# Patient Record
Sex: Male | Born: 1940 | Race: White | Hispanic: No | Smoking: Former smoker
Health system: Southern US, Community
[De-identification: ages and names within clinical notes are randomized; demographics above are authoritative.]

## PROBLEM LIST (undated history)

## (undated) DIAGNOSIS — M1990 Unspecified osteoarthritis, unspecified site: Secondary | ICD-10-CM

## (undated) DIAGNOSIS — N401 Enlarged prostate with lower urinary tract symptoms: Secondary | ICD-10-CM

## (undated) DIAGNOSIS — M109 Gout, unspecified: Secondary | ICD-10-CM

## (undated) DIAGNOSIS — Z9889 Other specified postprocedural states: Secondary | ICD-10-CM

## (undated) DIAGNOSIS — G8929 Other chronic pain: Secondary | ICD-10-CM

## (undated) DIAGNOSIS — Z86010 Personal history of colonic polyps: Secondary | ICD-10-CM

## (undated) DIAGNOSIS — R112 Nausea with vomiting, unspecified: Secondary | ICD-10-CM

## (undated) DIAGNOSIS — K2960 Other gastritis without bleeding: Secondary | ICD-10-CM

## (undated) DIAGNOSIS — T39395A Adverse effect of other nonsteroidal anti-inflammatory drugs [NSAID], initial encounter: Secondary | ICD-10-CM

## (undated) DIAGNOSIS — D509 Iron deficiency anemia, unspecified: Secondary | ICD-10-CM

## (undated) DIAGNOSIS — K219 Gastro-esophageal reflux disease without esophagitis: Secondary | ICD-10-CM

## (undated) DIAGNOSIS — M545 Low back pain, unspecified: Secondary | ICD-10-CM

## (undated) DIAGNOSIS — G2581 Restless legs syndrome: Secondary | ICD-10-CM

## (undated) DIAGNOSIS — I10 Essential (primary) hypertension: Secondary | ICD-10-CM

## (undated) DIAGNOSIS — J189 Pneumonia, unspecified organism: Secondary | ICD-10-CM

## (undated) DIAGNOSIS — T8859XA Other complications of anesthesia, initial encounter: Secondary | ICD-10-CM

## (undated) DIAGNOSIS — G5601 Carpal tunnel syndrome, right upper limb: Secondary | ICD-10-CM

## (undated) DIAGNOSIS — T4145XA Adverse effect of unspecified anesthetic, initial encounter: Secondary | ICD-10-CM

## (undated) DIAGNOSIS — M722 Plantar fascial fibromatosis: Secondary | ICD-10-CM

## (undated) DIAGNOSIS — R351 Nocturia: Secondary | ICD-10-CM

## (undated) DIAGNOSIS — M180 Bilateral primary osteoarthritis of first carpometacarpal joints: Secondary | ICD-10-CM

## (undated) DIAGNOSIS — G4700 Insomnia, unspecified: Secondary | ICD-10-CM

## (undated) HISTORY — DX: Adverse effect of other nonsteroidal anti-inflammatory drugs (NSAID), initial encounter: T39.395A

## (undated) HISTORY — DX: Pneumonia, unspecified organism: J18.9

## (undated) HISTORY — DX: Plantar fascial fibromatosis: M72.2

## (undated) HISTORY — PX: TOTAL KNEE ARTHROPLASTY: SHX125

## (undated) HISTORY — DX: Bilateral primary osteoarthritis of first carpometacarpal joints: M18.0

## (undated) HISTORY — DX: Low back pain, unspecified: M54.50

## (undated) HISTORY — DX: Personal history of colonic polyps: Z86.010

## (undated) HISTORY — DX: Other chronic pain: G89.29

## (undated) HISTORY — PX: MASS EXCISION: SHX2000

## (undated) HISTORY — DX: Gout, unspecified: M10.9

## (undated) HISTORY — DX: Iron deficiency anemia, unspecified: D50.9

## (undated) HISTORY — PX: CATARACT EXTRACTION: SUR2

## (undated) HISTORY — DX: Other gastritis without bleeding: K29.60

## (undated) HISTORY — PX: OTHER SURGICAL HISTORY: SHX169

## (undated) HISTORY — DX: Carpal tunnel syndrome, right upper limb: G56.01

## (undated) HISTORY — DX: Insomnia, unspecified: G47.00

## (undated) HISTORY — DX: Low back pain: M54.5

## (undated) HISTORY — DX: Restless legs syndrome: G25.81

## (undated) HISTORY — DX: Gastro-esophageal reflux disease without esophagitis: K21.9

---

## 1971-08-11 HISTORY — PX: LUMBAR SPINE SURGERY: SHX701

## 2004-08-10 HISTORY — PX: OTHER SURGICAL HISTORY: SHX169

## 2014-08-10 HISTORY — PX: OTHER SURGICAL HISTORY: SHX169

## 2015-03-07 ENCOUNTER — Other Ambulatory Visit: Payer: Self-pay | Admitting: Family Medicine

## 2015-03-07 DIAGNOSIS — Z136 Encounter for screening for cardiovascular disorders: Secondary | ICD-10-CM

## 2015-03-12 ENCOUNTER — Ambulatory Visit
Admission: RE | Admit: 2015-03-12 | Discharge: 2015-03-12 | Disposition: A | Discharge status: Home / Self Care | Payer: Self-pay | Source: Ambulatory Visit | Attending: Family Medicine | Admitting: Family Medicine

## 2015-03-12 DIAGNOSIS — Z136 Encounter for screening for cardiovascular disorders: Secondary | ICD-10-CM

## 2016-03-12 ENCOUNTER — Other Ambulatory Visit: Payer: Self-pay | Admitting: Orthopedic Surgery

## 2016-06-10 DIAGNOSIS — M722 Plantar fascial fibromatosis: Secondary | ICD-10-CM

## 2016-06-10 HISTORY — DX: Plantar fascial fibromatosis: M72.2

## 2016-06-29 ENCOUNTER — Encounter (HOSPITAL_BASED_OUTPATIENT_CLINIC_OR_DEPARTMENT_OTHER): Payer: Self-pay | Admitting: *Deleted

## 2016-06-30 ENCOUNTER — Encounter (HOSPITAL_BASED_OUTPATIENT_CLINIC_OR_DEPARTMENT_OTHER)
Admission: RE | Admit: 2016-06-30 | Discharge: 2016-06-30 | Disposition: A | Discharge status: Home / Self Care | Payer: Medicare Other | Source: Ambulatory Visit | Attending: Orthopedic Surgery | Admitting: Orthopedic Surgery

## 2016-06-30 DIAGNOSIS — Z029 Encounter for administrative examinations, unspecified: Secondary | ICD-10-CM | POA: Diagnosis present

## 2016-07-01 ENCOUNTER — Other Ambulatory Visit: Payer: Self-pay | Admitting: Orthopedic Surgery

## 2016-07-07 ENCOUNTER — Encounter (HOSPITAL_BASED_OUTPATIENT_CLINIC_OR_DEPARTMENT_OTHER): Payer: Self-pay | Admitting: Anesthesiology

## 2016-07-07 ENCOUNTER — Encounter (HOSPITAL_BASED_OUTPATIENT_CLINIC_OR_DEPARTMENT_OTHER)
Admission: RE | Disposition: A | Discharge status: Home / Self Care | Payer: Self-pay | Source: Ambulatory Visit | Attending: Orthopedic Surgery

## 2016-07-07 ENCOUNTER — Ambulatory Visit (HOSPITAL_BASED_OUTPATIENT_CLINIC_OR_DEPARTMENT_OTHER)
Admission: RE | Admit: 2016-07-07 | Discharge: 2016-07-07 | Disposition: A | Discharge status: Home / Self Care | Payer: Medicare Other | Source: Ambulatory Visit | Attending: Orthopedic Surgery | Admitting: Orthopedic Surgery

## 2016-07-07 DIAGNOSIS — Z538 Procedure and treatment not carried out for other reasons: Secondary | ICD-10-CM | POA: Insufficient documentation

## 2016-07-07 DIAGNOSIS — I10 Essential (primary) hypertension: Secondary | ICD-10-CM | POA: Insufficient documentation

## 2016-07-07 HISTORY — DX: Essential (primary) hypertension: I10

## 2016-07-07 HISTORY — DX: Other specified postprocedural states: Z98.890

## 2016-07-07 HISTORY — DX: Nocturia: R35.1

## 2016-07-07 HISTORY — DX: Unspecified osteoarthritis, unspecified site: M19.90

## 2016-07-07 HISTORY — DX: Benign prostatic hyperplasia with lower urinary tract symptoms: N40.1

## 2016-07-07 HISTORY — DX: Nausea with vomiting, unspecified: R11.2

## 2016-07-07 HISTORY — DX: Adverse effect of unspecified anesthetic, initial encounter: T41.45XA

## 2016-07-07 HISTORY — DX: Other complications of anesthesia, initial encounter: T88.59XA

## 2016-07-07 LAB — SEDIMENTATION RATE: Sed Rate: 44 mm/hr — ABNORMAL HIGH (ref 0–16)

## 2016-07-07 LAB — CBC WITH DIFFERENTIAL/PLATELET
BASOS ABS: 0 10*3/uL (ref 0.0–0.1)
Basophils Relative: 0 %
Eosinophils Absolute: 0.1 10*3/uL (ref 0.0–0.7)
Eosinophils Relative: 1 %
HEMATOCRIT: 38.3 % — AB (ref 39.0–52.0)
HEMOGLOBIN: 13.1 g/dL (ref 13.0–17.0)
LYMPHS PCT: 15 %
Lymphs Abs: 1.4 10*3/uL (ref 0.7–4.0)
MCH: 27.3 pg (ref 26.0–34.0)
MCHC: 34.2 g/dL (ref 30.0–36.0)
MCV: 79.8 fL (ref 78.0–100.0)
MONO ABS: 1 10*3/uL (ref 0.1–1.0)
Monocytes Relative: 11 %
NEUTROS ABS: 6.7 10*3/uL (ref 1.7–7.7)
NEUTROS PCT: 73 %
Platelets: 264 10*3/uL (ref 150–400)
RBC: 4.8 MIL/uL (ref 4.22–5.81)
RDW: 15 % (ref 11.5–15.5)
WBC: 9.2 10*3/uL (ref 4.0–10.5)

## 2016-07-07 LAB — URIC ACID: Uric Acid, Serum: 4.4 mg/dL (ref 4.4–7.6)

## 2016-07-07 SURGERY — CARPAL TUNNEL RELEASE
Anesthesia: Choice | Laterality: Right

## 2016-07-07 NOTE — Anesthesia Preprocedure Evaluation (Deleted)
Anesthesia Evaluation  Patient identified by MRN, date of birth, ID band Patient awake    Reviewed: Allergy & Precautions, NPO status , Patient's Chart, lab work & pertinent test results  Airway Mallampati: II  TM Distance: >3 FB Neck ROM: Full    Dental  (+) Dental Advisory Given   Pulmonary former smoker,    breath sounds clear to auscultation       Cardiovascular hypertension, Pt. on medications  Rhythm:Regular Rate:Normal     Neuro/Psych negative neurological ROS     GI/Hepatic negative GI ROS, Neg liver ROS,   Endo/Other  negative endocrine ROS  Renal/GU negative Renal ROS     Musculoskeletal  (+) Arthritis ,   Abdominal   Peds  Hematology negative hematology ROS (+)   Anesthesia Other Findings   Reproductive/Obstetrics                             Anesthesia Physical Anesthesia Plan  ASA: II  Anesthesia Plan: General   Post-op Pain Management:    Induction: Intravenous  Airway Management Planned: LMA  Additional Equipment:   Intra-op Plan:   Post-operative Plan: Extubation in OR  Informed Consent: I have reviewed the patients History and Physical, chart, labs and discussed the procedure including the risks, benefits and alternatives for the proposed anesthesia with the patient or authorized representative who has indicated his/her understanding and acceptance.   Dental advisory given  Plan Discussed with:   Anesthesia Plan Comments:         Anesthesia Quick Evaluation

## 2016-07-07 NOTE — Progress Notes (Signed)
Surgery cancelled due to possible infection/ gout to left arm per Dr. Fredna Dow. Labs drawn per Dr. Levell July order. Pt d/c home.

## 2016-07-09 NOTE — H&P (Signed)
Surgery cancelled

## 2016-07-28 ENCOUNTER — Encounter (HOSPITAL_BASED_OUTPATIENT_CLINIC_OR_DEPARTMENT_OTHER): Payer: Self-pay | Admitting: *Deleted

## 2016-07-28 ENCOUNTER — Other Ambulatory Visit: Payer: Self-pay | Admitting: Orthopedic Surgery

## 2016-07-30 ENCOUNTER — Encounter (HOSPITAL_BASED_OUTPATIENT_CLINIC_OR_DEPARTMENT_OTHER): Payer: Self-pay | Admitting: Anesthesiology

## 2016-07-30 ENCOUNTER — Ambulatory Visit (HOSPITAL_BASED_OUTPATIENT_CLINIC_OR_DEPARTMENT_OTHER): Payer: Medicare Other | Admitting: Anesthesiology

## 2016-07-30 ENCOUNTER — Ambulatory Visit (HOSPITAL_BASED_OUTPATIENT_CLINIC_OR_DEPARTMENT_OTHER)
Admission: RE | Admit: 2016-07-30 | Discharge: 2016-07-30 | Disposition: A | Discharge status: Home / Self Care | Payer: Medicare Other | Source: Ambulatory Visit | Attending: Orthopedic Surgery | Admitting: Orthopedic Surgery

## 2016-07-30 ENCOUNTER — Encounter (HOSPITAL_BASED_OUTPATIENT_CLINIC_OR_DEPARTMENT_OTHER)
Admission: RE | Disposition: A | Discharge status: Home / Self Care | Payer: Self-pay | Source: Ambulatory Visit | Attending: Orthopedic Surgery

## 2016-07-30 DIAGNOSIS — M19041 Primary osteoarthritis, right hand: Secondary | ICD-10-CM | POA: Insufficient documentation

## 2016-07-30 DIAGNOSIS — M1990 Unspecified osteoarthritis, unspecified site: Secondary | ICD-10-CM | POA: Diagnosis not present

## 2016-07-30 DIAGNOSIS — I10 Essential (primary) hypertension: Secondary | ICD-10-CM | POA: Diagnosis not present

## 2016-07-30 DIAGNOSIS — M1811 Unilateral primary osteoarthritis of first carpometacarpal joint, right hand: Secondary | ICD-10-CM | POA: Insufficient documentation

## 2016-07-30 DIAGNOSIS — Z79899 Other long term (current) drug therapy: Secondary | ICD-10-CM | POA: Insufficient documentation

## 2016-07-30 DIAGNOSIS — G5621 Lesion of ulnar nerve, right upper limb: Secondary | ICD-10-CM | POA: Diagnosis not present

## 2016-07-30 DIAGNOSIS — G5603 Carpal tunnel syndrome, bilateral upper limbs: Secondary | ICD-10-CM | POA: Insufficient documentation

## 2016-07-30 DIAGNOSIS — G5623 Lesion of ulnar nerve, bilateral upper limbs: Secondary | ICD-10-CM | POA: Insufficient documentation

## 2016-07-30 DIAGNOSIS — G5601 Carpal tunnel syndrome, right upper limb: Secondary | ICD-10-CM | POA: Diagnosis present

## 2016-07-30 DIAGNOSIS — Z87891 Personal history of nicotine dependence: Secondary | ICD-10-CM | POA: Diagnosis not present

## 2016-07-30 DIAGNOSIS — M5412 Radiculopathy, cervical region: Secondary | ICD-10-CM | POA: Insufficient documentation

## 2016-07-30 DIAGNOSIS — M419 Scoliosis, unspecified: Secondary | ICD-10-CM | POA: Insufficient documentation

## 2016-07-30 DIAGNOSIS — Z791 Long term (current) use of non-steroidal anti-inflammatories (NSAID): Secondary | ICD-10-CM | POA: Insufficient documentation

## 2016-07-30 HISTORY — PX: ULNAR NERVE TRANSPOSITION: SHX2595

## 2016-07-30 HISTORY — PX: CARPAL TUNNEL RELEASE: SHX101

## 2016-07-30 LAB — POCT I-STAT, CHEM 8
BUN: 20 mg/dL (ref 6–20)
CALCIUM ION: 1.26 mmol/L (ref 1.15–1.40)
CHLORIDE: 103 mmol/L (ref 101–111)
CREATININE: 0.8 mg/dL (ref 0.61–1.24)
GLUCOSE: 94 mg/dL (ref 65–99)
HCT: 47 % (ref 39.0–52.0)
Hemoglobin: 16 g/dL (ref 13.0–17.0)
POTASSIUM: 3.9 mmol/L (ref 3.5–5.1)
Sodium: 138 mmol/L (ref 135–145)
TCO2: 23 mmol/L (ref 0–100)

## 2016-07-30 SURGERY — CARPAL TUNNEL RELEASE
Anesthesia: Regional | Site: Arm Upper | Laterality: Right

## 2016-07-30 MED ORDER — CEFAZOLIN SODIUM-DEXTROSE 2-4 GM/100ML-% IV SOLN
2.0000 g | INTRAVENOUS | Status: AC
  Administered 2016-07-30: 2 g via INTRAVENOUS

## 2016-07-30 MED ORDER — FENTANYL CITRATE (PF) 100 MCG/2ML IJ SOLN
50.0000 ug | INTRAMUSCULAR | Status: DC | PRN
  Administered 2016-07-30: 100 ug via INTRAVENOUS

## 2016-07-30 MED ORDER — LIDOCAINE HCL (CARDIAC) 20 MG/ML IV SOLN
INTRAVENOUS | Status: DC | PRN
  Administered 2016-07-30: 40 mg via INTRAVENOUS

## 2016-07-30 MED ORDER — FENTANYL CITRATE (PF) 100 MCG/2ML IJ SOLN
INTRAMUSCULAR | Status: AC
  Filled 2016-07-30: qty 2

## 2016-07-30 MED ORDER — EPHEDRINE SULFATE 50 MG/ML IJ SOLN
INTRAMUSCULAR | Status: DC | PRN
  Administered 2016-07-30 (×3): 5 mg via INTRAVENOUS
  Administered 2016-07-30: 10 mg via INTRAVENOUS

## 2016-07-30 MED ORDER — HYDROCODONE-ACETAMINOPHEN 5-325 MG PO TABS: 1.0000 | ORAL_TABLET | Freq: Four times a day (QID) | ORAL | 0 refills | Status: DC | PRN

## 2016-07-30 MED ORDER — EPHEDRINE 5 MG/ML INJ
INTRAVENOUS | Status: AC
  Filled 2016-07-30: qty 10

## 2016-07-30 MED ORDER — GLYCOPYRROLATE 0.2 MG/ML IJ SOLN
INTRAMUSCULAR | Status: DC | PRN
  Administered 2016-07-30: 0.2 mg via INTRAVENOUS

## 2016-07-30 MED ORDER — BUPIVACAINE-EPINEPHRINE (PF) 0.5% -1:200000 IJ SOLN
INTRAMUSCULAR | Status: DC | PRN
  Administered 2016-07-30: 30 mL via PERINEURAL

## 2016-07-30 MED ORDER — LIDOCAINE 2% (20 MG/ML) 5 ML SYRINGE
INTRAMUSCULAR | Status: AC
  Filled 2016-07-30: qty 5

## 2016-07-30 MED ORDER — MIDAZOLAM HCL 2 MG/2ML IJ SOLN
INTRAMUSCULAR | Status: AC
  Filled 2016-07-30: qty 2

## 2016-07-30 MED ORDER — CHLORHEXIDINE GLUCONATE 4 % EX LIQD: 60.0000 mL | Freq: Once | CUTANEOUS | Status: DC

## 2016-07-30 MED ORDER — SCOPOLAMINE 1 MG/3DAYS TD PT72: 1.0000 | MEDICATED_PATCH | Freq: Once | TRANSDERMAL | Status: DC | PRN

## 2016-07-30 MED ORDER — MIDAZOLAM HCL 2 MG/2ML IJ SOLN
1.0000 mg | INTRAMUSCULAR | Status: DC | PRN
  Administered 2016-07-30: 1 mg via INTRAVENOUS

## 2016-07-30 MED ORDER — LACTATED RINGERS IV SOLN
INTRAVENOUS | Status: DC
  Administered 2016-07-30: 11:00:00 via INTRAVENOUS

## 2016-07-30 MED ORDER — PROPOFOL 10 MG/ML IV BOLUS
INTRAVENOUS | Status: AC
  Filled 2016-07-30: qty 20

## 2016-07-30 MED ORDER — FENTANYL CITRATE (PF) 100 MCG/2ML IJ SOLN: 25.0000 ug | INTRAMUSCULAR | Status: DC | PRN

## 2016-07-30 MED ORDER — ONDANSETRON HCL 4 MG/2ML IJ SOLN
INTRAMUSCULAR | Status: DC | PRN
  Administered 2016-07-30: 4 mg via INTRAVENOUS

## 2016-07-30 MED ORDER — DEXAMETHASONE SODIUM PHOSPHATE 4 MG/ML IJ SOLN
INTRAMUSCULAR | Status: DC | PRN
  Administered 2016-07-30: 5 mg via INTRAVENOUS

## 2016-07-30 MED ORDER — ONDANSETRON HCL 4 MG/2ML IJ SOLN
INTRAMUSCULAR | Status: AC
  Filled 2016-07-30: qty 2

## 2016-07-30 MED ORDER — PROPOFOL 10 MG/ML IV BOLUS
INTRAVENOUS | Status: DC | PRN
  Administered 2016-07-30: 150 mg via INTRAVENOUS

## 2016-07-30 SURGICAL SUPPLY — 51 items
BLADE MINI RND TIP GREEN BEAV (BLADE) IMPLANT
BLADE SURG 15 STRL LF DISP TIS (BLADE) ×2 IMPLANT
BLADE SURG 15 STRL SS (BLADE) ×2
BNDG COHESIVE 3X5 TAN STRL LF (GAUZE/BANDAGES/DRESSINGS) ×8 IMPLANT
BNDG ESMARK 4X9 LF (GAUZE/BANDAGES/DRESSINGS) ×4 IMPLANT
BNDG GAUZE ELAST 4 BULKY (GAUZE/BANDAGES/DRESSINGS) ×4 IMPLANT
CHLORAPREP W/TINT 26ML (MISCELLANEOUS) ×4 IMPLANT
CORDS BIPOLAR (ELECTRODE) ×4 IMPLANT
COVER BACK TABLE 60X90IN (DRAPES) ×4 IMPLANT
COVER MAYO STAND STRL (DRAPES) ×4 IMPLANT
CUFF TOURN SGL LL 18 NRW (TOURNIQUET CUFF) ×4 IMPLANT
CUFF TOURNIQUET SINGLE 18IN (TOURNIQUET CUFF) ×4 IMPLANT
DECANTER SPIKE VIAL GLASS SM (MISCELLANEOUS) IMPLANT
DRAPE EXTREMITY T 121X128X90 (DRAPE) ×4 IMPLANT
DRAPE SURG 17X23 STRL (DRAPES) ×4 IMPLANT
DRSG PAD ABDOMINAL 8X10 ST (GAUZE/BANDAGES/DRESSINGS) ×8 IMPLANT
GAUZE SPONGE 4X4 12PLY STRL (GAUZE/BANDAGES/DRESSINGS) ×4 IMPLANT
GAUZE SPONGE 4X4 16PLY XRAY LF (GAUZE/BANDAGES/DRESSINGS) IMPLANT
GAUZE XEROFORM 1X8 LF (GAUZE/BANDAGES/DRESSINGS) ×4 IMPLANT
GLOVE BIOGEL PI IND STRL 7.0 (GLOVE) ×2 IMPLANT
GLOVE BIOGEL PI IND STRL 8 (GLOVE) ×2 IMPLANT
GLOVE BIOGEL PI IND STRL 8.5 (GLOVE) ×2 IMPLANT
GLOVE BIOGEL PI INDICATOR 7.0 (GLOVE) ×2
GLOVE BIOGEL PI INDICATOR 8 (GLOVE) ×2
GLOVE BIOGEL PI INDICATOR 8.5 (GLOVE) ×2
GLOVE ECLIPSE 6.5 STRL STRAW (GLOVE) ×4 IMPLANT
GLOVE SURG ORTHO 8.0 STRL STRW (GLOVE) ×4 IMPLANT
GOWN STRL REUS W/ TWL LRG LVL3 (GOWN DISPOSABLE) ×2 IMPLANT
GOWN STRL REUS W/TWL LRG LVL3 (GOWN DISPOSABLE) ×2
GOWN STRL REUS W/TWL XL LVL3 (GOWN DISPOSABLE) ×4 IMPLANT
LOOP VESSEL MAXI BLUE (MISCELLANEOUS) ×4 IMPLANT
NEEDLE PRECISIONGLIDE 27X1.5 (NEEDLE) ×4 IMPLANT
NS IRRIG 1000ML POUR BTL (IV SOLUTION) ×4 IMPLANT
PACK BASIN DAY SURGERY FS (CUSTOM PROCEDURE TRAY) ×4 IMPLANT
PAD CAST 3X4 CTTN HI CHSV (CAST SUPPLIES) ×2 IMPLANT
PAD CAST 4YDX4 CTTN HI CHSV (CAST SUPPLIES) ×2 IMPLANT
PADDING CAST COTTON 3X4 STRL (CAST SUPPLIES) ×2
PADDING CAST COTTON 4X4 STRL (CAST SUPPLIES) ×2
SLEEVE SCD COMPRESS KNEE MED (MISCELLANEOUS) ×4 IMPLANT
SLING ARM FOAM STRAP LRG (SOFTGOODS) ×4 IMPLANT
SPLINT PLASTER CAST XFAST 3X15 (CAST SUPPLIES) IMPLANT
SPLINT PLASTER XTRA FASTSET 3X (CAST SUPPLIES)
STOCKINETTE 4X48 STRL (DRAPES) ×4 IMPLANT
SUT ETHILON 4 0 PS 2 18 (SUTURE) ×8 IMPLANT
SUT VIC AB 2-0 SH 27 (SUTURE) ×2
SUT VIC AB 2-0 SH 27XBRD (SUTURE) ×2 IMPLANT
SUT VICRYL 4-0 PS2 18IN ABS (SUTURE) ×4 IMPLANT
SYR BULB 3OZ (MISCELLANEOUS) ×4 IMPLANT
SYR CONTROL 10ML LL (SYRINGE) ×4 IMPLANT
TOWEL OR 17X24 6PK STRL BLUE (TOWEL DISPOSABLE) ×4 IMPLANT
UNDERPAD 30X30 (UNDERPADS AND DIAPERS) ×4 IMPLANT

## 2016-07-30 NOTE — Anesthesia Procedure Notes (Addendum)
Anesthesia Regional Block:  Supraclavicular block  Pre-Anesthetic Checklist: ,, timeout performed, Correct Patient, Correct Site, Correct Laterality, Correct Procedure, Correct Position, site marked, Risks and benefits discussed, pre-op evaluation,  At surgeon's request and post-op pain management  Laterality: Right  Prep: Maximum Sterile Barrier Precautions used, chloraprep       Needles:  Injection technique: Single-shot  Needle Type: Echogenic Stimulator Needle     Needle Length: 5cm 5 cm Needle Gauge: 22 and 22 G    Additional Needles:  Procedures: ultrasound guided (picture in chart) Supraclavicular block Narrative:  Start time: 07/30/2016 10:30 AM End time: 07/30/2016 10:40 AM Injection made incrementally with aspirations every 5 mL. Anesthesiologist: Roderic Palau  Additional Notes: 2% Lidocaine skin wheel.

## 2016-07-30 NOTE — Brief Op Note (Signed)
07/30/2016  11:47 AM  PATIENT:  Ruben Holmes  75 y.o. male  PRE-OPERATIVE DIAGNOSIS:  right carpal tunnel, right cubital tunnel syndrome  G56.01, G56.21  POST-OPERATIVE DIAGNOSIS:  right carpal tunnel, right cubital tunnel syndrome  G56.01, G56.21  PROCEDURE:  Procedure(s): RIGHT CARPAL TUNNEL RELEASE (Right) RIGHT ULNAR NERVE DECOMPRESSION/TRANSPOSITION (Right)  SURGEON:  Surgeon(s) and Role:    * Daryll Brod, MD - Primary  PHYSICIAN ASSISTANT:   ASSISTANTS: none   ANESTHESIA:   General and regional  EBL:  No intake/output data recorded.  BLOOD ADMINISTERED:none  DRAINS: none   LOCAL MEDICATIONS USED:  NONE  SPECIMEN:  No Specimen  DISPOSITION OF SPECIMEN:  N/A  COUNTS:  YES  TOURNIQUET:   Total Tourniquet Time Documented: Upper Arm (Right) - 32 minutes Total: Upper Arm (Right) - 32 minutes   DICTATION: .Other Dictation: Dictation Number [id number]  PLAN OF CARE: Discharge to home after PACU  PATIENT DISPOSITION:  PACU - hemodynamically stable.

## 2016-07-30 NOTE — Anesthesia Preprocedure Evaluation (Addendum)
Anesthesia Evaluation  Patient identified by MRN, date of birth, ID band Patient awake    Reviewed: Allergy & Precautions, H&P , NPO status , Patient's Chart, lab work & pertinent test results  History of Anesthesia Complications (+) PONV  Airway Mallampati: III  TM Distance: >3 FB Neck ROM: Full    Dental no notable dental hx. (+) Teeth Intact, Dental Advisory Given   Pulmonary neg pulmonary ROS, former smoker,    Pulmonary exam normal breath sounds clear to auscultation       Cardiovascular hypertension, Pt. on medications  Rhythm:Regular Rate:Normal     Neuro/Psych negative neurological ROS  negative psych ROS   GI/Hepatic negative GI ROS, Neg liver ROS,   Endo/Other  negative endocrine ROS  Renal/GU negative Renal ROS  negative genitourinary   Musculoskeletal  (+) Arthritis , Osteoarthritis,    Abdominal   Peds  Hematology negative hematology ROS (+)   Anesthesia Other Findings   Reproductive/Obstetrics negative OB ROS                            Anesthesia Physical Anesthesia Plan  ASA: II  Anesthesia Plan: General and Regional   Post-op Pain Management: GA combined w/ Regional for post-op pain   Induction: Intravenous  Airway Management Planned: LMA  Additional Equipment:   Intra-op Plan:   Post-operative Plan: Extubation in OR  Informed Consent: I have reviewed the patients History and Physical, chart, labs and discussed the procedure including the risks, benefits and alternatives for the proposed anesthesia with the patient or authorized representative who has indicated his/her understanding and acceptance.   Dental advisory given  Plan Discussed with: CRNA  Anesthesia Plan Comments:         Anesthesia Quick Evaluation

## 2016-07-30 NOTE — Op Note (Signed)
Dictation Number [id number]

## 2016-07-30 NOTE — Anesthesia Procedure Notes (Signed)
Procedure Name: LMA Insertion Date/Time: 07/30/2016 10:58 AM Performed by: Rayvon Char Pre-anesthesia Checklist: Patient identified, Emergency Drugs available, Suction available and Patient being monitored Patient Re-evaluated:Patient Re-evaluated prior to inductionOxygen Delivery Method: Circle system utilized Preoxygenation: Pre-oxygenation with 100% oxygen Intubation Type: IV induction Ventilation: Mask ventilation without difficulty LMA: LMA inserted LMA Size: 5.0 Number of attempts: 1 Dental Injury: Teeth and Oropharynx as per pre-operative assessment

## 2016-07-30 NOTE — Transfer of Care (Signed)
Immediate Anesthesia Transfer of Care Note  Patient: Ruben Holmes  Procedure(s) Performed: Procedure(s): RIGHT CARPAL TUNNEL RELEASE (Right) RIGHT ULNAR NERVE DECOMPRESSION/TRANSPOSITION (Right)  Patient Location: PACU  Anesthesia Type:GA combined with regional for post-op pain  Level of Consciousness: sedated and patient cooperative  Airway & Oxygen Therapy: Patient Spontanous Breathing and Patient connected to face mask oxygen  Post-op Assessment: Report given to RN and Post -op Vital signs reviewed and stable  Post vital signs: Reviewed and stable  Last Vitals:  Vitals:   07/30/16 1030 07/30/16 1045  BP: (!) 171/103 (!) 151/87    Last Pain: There were no vitals filed for this visit.       Complications: No apparent anesthesia complications

## 2016-07-30 NOTE — Op Note (Signed)
Ruben Holmes, Ruben Holmes NO.:  [id number]  MEDICAL RECORD NO.:  [id number]  LOCATION:                                 FACILITY:  PHYSICIAN:  Daryll Brod, M.D.            DATE OF BIRTH:  DATE OF PROCEDURE:  07/30/2016 DATE OF DISCHARGE:                              OPERATIVE REPORT   PREOPERATIVE DIAGNOSES:  Carpal tunnel syndrome, right hand.  Cubital tunnel syndrome, right elbow.  POSTOPERATIVE DIAGNOSES:  Carpal tunnel syndrome, right hand.  Cubital tunnel syndrome, right elbow.  OPERATION:  Decompression of median nerve, right wrist with decompression of ulnar nerve, right elbow.  SURGEON:  Daryll Brod, MD.  ANESTHESIA:  Supraclavicular block, general.  PLACE OF SURGERY:  [facility].  ANESTHESIOLOGIST:  Soledad Gerlach, MD.  HISTORY:  The patient is a 75 year old male with a history of carpal tunnel syndrome, EMG nerve conduction is positive along with cubital tunnel syndrome bilaterally.  This is not responded to conservative treatment.  He has elected to undergo surgical decompression of the median nerve, decompression with possible transposition to the ulnar nerve at his right elbow.  Pre, peri, and postoperative course have been discussed along with risks and complications.  He is aware that there is no guarantee to the surgery, the possibility of infection; recurrence of injury to arteries, nerves, tendons; incomplete relief of symptoms and dystrophy.  In the preoperative area, the patient is seen, the extremity marked by both patient and surgeon.  Antibiotic is also given.  PROCEDURE IN DETAIL:  The patient was brought to the operating room after a supraclavicular block was carried out without difficulty in the preoperative area.  He was given a general anesthetic, prepped and draped in supine position with the right arm free.  Prep was done with ChloraPrep and a 3-minute dry time was allowed.  Time-out was taken confirming the  patient and procedure.  The limb was exsanguinated with an Esmarch bandage.  Tourniquet placed high on the arm was inflated to 250 mmHg.  A longitudinal incision was made in the right palm, carried down through subcutaneous tissue.  Bleeders were electrocauterized with bipolar.  The palmar fascia was split and the superficial palmar arch identified.  Right-angle and Sewall retractor were used to protect median and ulnar nerves radially and ulnarly after identification of the flexor tendon to the ring finger.  Flexor retinaculum was then incised on its ulnar border.  The right-angle and Sewall retractor were placed between skin and forearm fascia proximally allowing the distal forearm fascia to be incised for approximately 2 cm proximal to the wrist crease under direct vision.  The canal was explored.  No further lesions were identified.  Area of compression to the nerve was apparent.  The motor branch was noted to enter into muscle distally.  Wound was copiously irrigated with saline and the skin closed with interrupted 4-0 nylon sutures.  A separate incision was then made longitudinally over the medial epicondyle of the right elbow.  This measured approximately 4 cm in length.  The dissection was carried down through the subcutaneous tissue.  A posterior branches of  the medial antebrachial cutaneous nerve of the forearm were identified and protected.  The ulnar nerve was identified.  There was a large medial head of the triceps present.  The Osborne fascia was then released on its posterior aspect.  The nerve was identified entering into the flexor carpi ulnaris.  The subcutaneous tissue was then dissected from the underlying fascia.  A knee retractor was then placed.  The superficial fascia of the flexor carpi ulnaris 2 heads was then cut with blunt scissors.  The 2 heads of the flexor carpi ulnaris was then separated.  A KMI guide for carpal tunnel release was then inserted between the  ulnar nerve and the deep fascia of the flexor carpi ulnaris and using angled ENT scissors straight in nature, the fascia was then released for approximately 8 cm to 10 cm distally. Attention was then directed proximally.  The brachial fascia was then separated from the overlying subcutaneous tissue and skin.  The knee retractor was placed proximally.  The [facility] guide was placed between the ulnar nerve proximally and using the ENT scissors, the brachial fascia was released proximally for a similar distance as was done distally. The nerve was then identified, found to be entirely decompressed proximally and distally with flexion of the elbow and there was no subluxation or translocation noted to the nerve.  The medial head of the triceps was then folded over onto itself and sutured into position with 2-0 Vicryl sutures in a more posterior position to prevent any compression of the ulnar nerve against the medial epicondyle.  Wound was copiously irrigated with saline.  The subcutaneous tissue was closed with interrupted 4-0 Vicryl sutures.  The skin was closed with interrupted 4-0 nylon sutures.  A sterile compressive dressing to the wrist and elbow was applied.  On deflation of the tourniquet, all fingers immediately pinked.  He was taken to the recovery room for observation in satisfactory condition.  He will be discharged to home to return to [place] in 1 week, on [place].          ______________________________ Daryll Brod, M.D.     GK/MEDQ  D:  07/30/2016  T:  07/30/2016  Job:  [id number]

## 2016-07-30 NOTE — H&P (Signed)
Ruben Holmes is an 75 y.o. male.   Chief Complaint: numbness rigth arm HPI: Ruben Holmes is a 75 year old right-hand dominant male who is seen per Ruben Holmes in consultation. He is complaining of numbness, tingling in thumbs and ring finger that has been going on for approximately eight months. He states it is getting progressively worse. He is not complaining of any pain or discomfort. He is not having symptoms on his left side. These are primarily on the right. He has no history of injury to the hand or neck. He is not awakened at night. He, however, has constant numbness. Nothing seems to make it better or worse for him. He has a history of scoliosis. He is taking ibuprofen for discomfort. He has no history of diabetes, thyroid problems. He does have a history of arthritis. There is no history of gout.He has seen Ruben Holmes for his neck. Ruben Holmes has told him to proceed with his hands prior to doing anything to his neck. He was scheduled for nerve conductions with Ruben Holmes. He has had these performed. 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 He shows motor delay of 10.63 on his right side and motor delay of 7.24 in his left side. He shows no response to the sensory component on either side. Is not complaining of any pain. He has constant numbness and tingling.          Past Medical History:  Diagnosis Date  . Arthritis    neck, hands  . BPH associated with nocturia   . Complication of anesthesia   . Hypertension   . PONV (postoperative nausea and vomiting)     Past Surgical History:  Procedure Laterality Date  . BACK SURGERY    . EYE SURGERY Bilateral    cataract  . JOINT REPLACEMENT Bilateral    bTKR    History reviewed. No pertinent family history. Social History:  reports that he has quit smoking. He has never used  smokeless tobacco. He reports that he drinks alcohol. He reports that he does not use drugs.  Allergies: No Known Allergies  No prescriptions prior to admission.    No results found for this or any previous visit (from the past 48 hour(s)).  No results found.   Pertinent items are noted in HPI.  Height 5' 10.5" (1.791 m), weight 92.1 kg (203 lb).  General appearance: alert, cooperative and appears stated age Head: Normocephalic, without obvious abnormality Neck: no JVD Resp: clear to auscultation bilaterally Cardio: regular rate and rhythm, S1, S2 normal, no murmur, click, rub or gallop GI: soft, non-tender; bowel sounds normal; no masses,  no organomegaly Extremities: numbness right arm Pulses: 2+ and symmetric Skin: Skin color, texture, turgor normal. No rashes or lesions Neurologic: Grossly normal Incision/Wound: na  Assessment/Plan Assessment:  1. Cervicalgia  2. Osteoarthritis of finger, right  3. Primary osteoarthritis of first carpometacarpal joint of right hand    Plan: We have discussed the possibility of surgical decompression of the median nerves bilaterally. We have discussed the possibility of decompression and transposition to the ulnar nerves bilaterally. Pre-peri-and postoperative course have been discussed along with risks and complications. He is aware that there is no guarantee with the surgery the possibility of infection recurrence injury to arteries nerves tendons incomplete relief of symptoms and dystrophy. He is advised that we are attempting to halt the  process and hopefully this will get better.  He would like to proceed with his right side and he is scheduled for carpal tunnel release right hand and decompression possible transposition ulnar nerve right elbow. Ends are encouraged and answered to his satisfaction.      Ruben Holmes R 07/30/2016, 8:22 AM

## 2016-07-30 NOTE — Anesthesia Postprocedure Evaluation (Signed)
Anesthesia Post Note  Patient: Juris Sermersheim  Procedure(s) Performed: Procedure(s) (LRB): RIGHT CARPAL TUNNEL RELEASE (Right) RIGHT ULNAR NERVE DECOMPRESSION/TRANSPOSITION (Right)  Patient location during evaluation: PACU Anesthesia Type: General and Regional Level of consciousness: awake and alert Pain management: pain level controlled Vital Signs Assessment: post-procedure vital signs reviewed and stable Respiratory status: spontaneous breathing, nonlabored ventilation and respiratory function stable Cardiovascular status: blood pressure returned to baseline and stable Postop Assessment: no signs of nausea or vomiting Anesthetic complications: no       Last Vitals:  Vitals:   07/30/16 1152 07/30/16 1200  BP: 134/77 126/76  Pulse: 94 90  Resp: 10 12  Temp: 36.6 C     Last Pain:  Vitals:   07/30/16 1152  PainSc: 0-No pain                 Glenn Gullickson,W. EDMOND

## 2016-07-30 NOTE — Progress Notes (Signed)
Assisted Dr. Edmond Fitzgerald with right, ultrasound guided, supraclavicular block. Side rails up, monitors on throughout procedure. See vital signs in flow sheet. Tolerated Procedure well. 

## 2016-07-30 NOTE — Discharge Instructions (Addendum)

## 2017-05-14 ENCOUNTER — Encounter: Payer: Self-pay | Admitting: Internal Medicine

## 2017-05-17 ENCOUNTER — Encounter: Payer: Self-pay | Admitting: Neurology

## 2017-05-19 ENCOUNTER — Encounter: Payer: Self-pay | Admitting: Neurology

## 2017-05-19 ENCOUNTER — Ambulatory Visit (INDEPENDENT_AMBULATORY_CARE_PROVIDER_SITE_OTHER): Payer: Medicare Other | Admitting: Neurology

## 2017-05-19 VITALS — BP 167/103 | HR 73 | Ht 71.0 in | Wt 220.0 lb

## 2017-05-19 DIAGNOSIS — G2581 Restless legs syndrome: Secondary | ICD-10-CM | POA: Diagnosis not present

## 2017-05-19 DIAGNOSIS — M150 Primary generalized (osteo)arthritis: Secondary | ICD-10-CM

## 2017-05-19 DIAGNOSIS — M159 Polyosteoarthritis, unspecified: Secondary | ICD-10-CM

## 2017-05-19 NOTE — Patient Instructions (Signed)
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
## 2017-07-12 ENCOUNTER — Encounter (INDEPENDENT_AMBULATORY_CARE_PROVIDER_SITE_OTHER): Payer: Self-pay

## 2017-07-12 ENCOUNTER — Encounter: Payer: Self-pay | Admitting: Internal Medicine

## 2017-07-12 ENCOUNTER — Ambulatory Visit: Payer: Medicare Other | Admitting: Internal Medicine

## 2017-07-12 VITALS — BP 138/90 | HR 80 | Ht 70.0 in | Wt 225.0 lb

## 2017-07-12 DIAGNOSIS — Z791 Long term (current) use of non-steroidal anti-inflammatories (NSAID): Secondary | ICD-10-CM

## 2017-07-12 DIAGNOSIS — D508 Other iron deficiency anemias: Secondary | ICD-10-CM | POA: Diagnosis not present

## 2017-07-12 NOTE — Patient Instructions (Addendum)
You have been scheduled for an endoscopy and colonoscopy. Please follow the written instructions given to you at your visit today. Please pick up your prep supplies at the pharmacy. If you use inhalers (even only as needed), please bring them with you on the day of your procedure.   I appreciate the opportunity to care for you. Carl Gessner, MD, FACG 

## 2017-07-12 NOTE — Progress Notes (Signed)
Ruben Holmes 76 y.o. 1940-12-24 [id number]  Assessment & Plan:   Encounter Diagnoses  Name Primary?  . iron deficiency anemia Yes  . NSAID long-term use      EGD/colonoscopy to evaluate for GI blood loss as a source of iron deficiency anemia. The risks and benefits as well as alternatives of endoscopic procedure(s) have been discussed and reviewed. All questions answered. The patient agrees to proceed.   I appreciate the opportunity to care for him. Gatha Mayer, MD, [facility]  I appreciate the opportunity to care for this patient. CC: Leanna Battles, MD  Subjective:   Chief Complaint: Anemia  HPI The patient is a 76 year old white man here at the request of Dr. Leanna Battles because of a microcytic anemia.  His hemoglobin was 11.9, with an MCV 76 in August.  Subsequent testing showed iron saturation of 8% with a high TIBC.  Annual occult blood testing, hemo-sure was negative.  He has not seen any bleeding.  He denies any GI problems though he relates a history of a colonoscopy he thinks in 2010, when he lived in [place] and might of had a polyp.  I do not have those records.  He does take ibuprofen chronically for arthritis and joint pains.  He does not take a PPI.  He has restless leg syndrome.  It has been somewhat worse lately.  He has been started on iron supplementation.  I did not ask today but I do not think he is a blood donor.  Kidney function is normal.  I have reviewed labs and primary care notes from August and September 2018.  No Known Allergies Current Meds  Medication Sig  . amLODipine (NORVASC) 5 MG tablet Take 1 tablet by mouth daily.  . Calcium Carbonate (CALCIUM 600 PO) Take 1 tablet by mouth daily.  . carbidopa-levodopa (SINEMET IR) 10-100 MG tablet Take 1 tablet by mouth 2 (two) times daily.  . Cholecalciferol (VITAMIN D3) 2000 units TABS Take 1 tablet by mouth daily.  . Coenzyme Q10 (COQ10) 400 MG CAPS Take by mouth.  . docusate sodium (COLACE)  100 MG capsule Take 100 mg by mouth daily.  . finasteride (PROSCAR) 5 MG tablet Take 5 mg by mouth daily.  Marland Kitchen ibuprofen (ADVIL,MOTRIN) 200 MG tablet Take 600 mg by mouth 2 (two) times daily.  . Licorice, Glycyrrhiza glabra, (LICORICE PO) Take 1 tablet by mouth as needed.  Marland Kitchen lisinopril (PRINIVIL,ZESTRIL) 40 MG tablet Take 40 mg by mouth daily.  . magnesium oxide (MAG-OX) 400 (241.3 Mg) MG tablet Take 400 mg by mouth daily.  . Melatonin 5 MG TABS Take 1 tablet by mouth at bedtime.  . Misc Natural Products (OSTEO BI-FLEX ADV JOINT SHIELD PO) Take 1 tablet by mouth 2 (two) times daily.   . Nutritional Supplements (GRAPESEED EXTRACT) 500-50 MG CAPS Take by mouth.  . Polysaccharide Iron Complex (FERREX 150 PO) Take 1 tablet by mouth daily.  . Potassium 99 MG TABS Take 1 tablet by mouth daily.  Marland Kitchen rOPINIRole (REQUIP) 1 MG tablet Take 5 mg by mouth 3 (three) times daily.   . tamsulosin (FLOMAX) 0.4 MG CAPS capsule Take 0.4 mg by mouth.   Past Medical History:  Diagnosis Date  . Arthritis    neck, hands  . BPH associated with nocturia   . Carpal tunnel syndrome on right   . Chronic low back pain   . Complication of anesthesia   . GERD (gastroesophageal reflux disease)   . Gout   .  Hypertension   . Insomnia   . Microcytic anemia   . Osteoarthritis of both thumbs    MCP joints  . Plantar fasciitis, left 06/2016  . Pneumonia   . PONV (postoperative nausea and vomiting)   . Restless leg syndrome    Past Surgical History:  Procedure Laterality Date  . bilateral blepharoplasty surgery Bilateral 2016  . CARPAL TUNNEL RELEASE Right 07/30/2016   Procedure: RIGHT CARPAL TUNNEL RELEASE;  Surgeon: Daryll Brod, MD;  Location: [facility];  Service: Orthopedics;  Laterality: Right;  . CATARACT EXTRACTION Bilateral    cataract with laser surgery in both eyes  . colonscopy     one polyp  . laser treatment for BPH  2006  . [place]  . MASS EXCISION     back  . TOTAL  KNEE ARTHROPLASTY Bilateral    bTKR, 2008 left and 2009 right  . ULNAR NERVE TRANSPOSITION Right 07/30/2016   Procedure: RIGHT ULNAR NERVE DECOMPRESSION/TRANSPOSITION;  Surgeon: Daryll Brod, MD;  Location: [facility];  Service: Orthopedics;  Laterality: Right;   Social History   Social History Narrative   The patient is widowed.  Months.  He relocated from the [place] area within the past couple of years, as he has a sister that lives in [place].  He was a Engineer, maintenance (IT), retired.   Also a Gaffer   Former smoker   No alcohol or drug use   07/12/2017   family history includes Arthritis in his father and mother; Breast cancer in his mother; Cancer in his brother; Diabetes in his father; Heart attack in his father; Hypertension in his father; Irritable bowel syndrome in his sister; Liver cancer in his mother; Lung cancer in his brother; Psoriasis in his brother; Ulcers in his mother.   Review of Systems As per HPI.  There is chronic back pain.  Arthritis joint pains allergies and sinus problems.  The remainder the review of systems is negative.  Objective:   Physical Exam @BP  138/90 (BP Location: Left Arm, Patient Position: Sitting, Cuff Size: Normal)   Pulse 80   Ht 5\' 10"  (1.778 m) Comment: height measured without shoes  Wt 225 lb (102.1 kg)   BMI 32.28 kg/m @  General:  Well-developed, well-nourished and in no acute distress Eyes:  anicteric. ENT:   Mouth and posterior pharynx free of lesions.  Neck:   supple w/o thyromegaly or mass.  Lungs: Clear to auscultation bilaterally. Heart:  S1S2, no rubs, murmurs, gallops. Abdomen:  soft, non-tender, no hepatosplenomegaly, hernia, or mass and BS+.  Rectal: deferred Lymph:  no cervical or supraclavicular adenopathy. Extremities:   Trace bilateral ankle edema, cyanosis or clubbing Skin   no rash. Neuro:  A&O x 3.  Psych:  appropriate mood and  Affect.   Data Reviewed: See HPI

## 2017-08-10 HISTORY — PX: NASAL SEPTUM SURGERY: SHX37

## 2017-08-19 ENCOUNTER — Ambulatory Visit (AMBULATORY_SURGERY_CENTER): Payer: Medicare Other | Admitting: Internal Medicine

## 2017-08-19 ENCOUNTER — Encounter: Payer: Self-pay | Admitting: Internal Medicine

## 2017-08-19 ENCOUNTER — Other Ambulatory Visit: Payer: Self-pay

## 2017-08-19 VITALS — BP 118/81 | HR 73 | Temp 96.9°F | Resp 15 | Ht 70.0 in | Wt 225.0 lb

## 2017-08-19 DIAGNOSIS — K2960 Other gastritis without bleeding: Secondary | ICD-10-CM | POA: Diagnosis not present

## 2017-08-19 DIAGNOSIS — D509 Iron deficiency anemia, unspecified: Secondary | ICD-10-CM | POA: Diagnosis present

## 2017-08-19 DIAGNOSIS — D125 Benign neoplasm of sigmoid colon: Secondary | ICD-10-CM

## 2017-08-19 DIAGNOSIS — D122 Benign neoplasm of ascending colon: Secondary | ICD-10-CM | POA: Diagnosis not present

## 2017-08-19 MED ORDER — PANTOPRAZOLE SODIUM 40 MG PO TBEC: 40.0000 mg | DELAYED_RELEASE_TABLET | Freq: Every day | ORAL | 3 refills | Status: DC

## 2017-08-19 MED ORDER — FLEET ENEMA 7-19 GM/118ML RE ENEM
1.0000 | ENEMA | Freq: Once | RECTAL | Status: AC
  Administered 2017-08-19: 1 via RECTAL

## 2017-08-19 MED ORDER — SODIUM CHLORIDE 0.9 % IV SOLN: 500.0000 mL | Freq: Once | INTRAVENOUS | Status: DC

## 2017-08-19 NOTE — Op Note (Signed)
[facility] Patient Name: Ruben Holmes Procedure Date: 08/19/2017 12:42 PM MRN: [id number] Endoscopist: Gatha Mayer , MD Age: 77 Referring MD:  Date of Birth: 1941-06-17 Gender: Male Account #: [id number] Procedure:                Colonoscopy Indications:              Iron deficiency anemia Medicines:                Propofol per Anesthesia, Monitored Anesthesia Care Procedure:                Pre-Anesthesia Assessment:                           - Prior to the procedure, a History and Physical                            was performed, and patient medications and                            allergies were reviewed. The patient's tolerance of                            previous anesthesia was also reviewed. The risks                            and benefits of the procedure and the sedation                            options and risks were discussed with the patient.                            All questions were answered, and informed consent                            was obtained. Prior Anticoagulants: The patient                            last took ibuprofen 1 day prior to the procedure.                            ASA Grade Assessment: III - A patient with severe                            systemic disease. After reviewing the risks and                            benefits, the patient was deemed in satisfactory                            condition to undergo the procedure.                           After obtaining informed consent, the colonoscope  was passed under direct vision. Throughout the                            procedure, the patient's blood pressure, pulse, and                            oxygen saturations were monitored continuously. The                            Colonoscope was introduced through the anus and                            advanced to the the cecum, identified by                            appendiceal orifice and ileocecal  valve. The                            colonoscopy was somewhat difficult due to                            significant looping. Successful completion of the                            procedure was aided by using manual pressure. The                            quality of the bowel preparation was adequate. The                            bowel preparation used was Miralax. The ileocecal                            valve, appendiceal orifice, and rectum were                            photographed. The patient tolerated the procedure                            well. Scope In: 1:34:19 PM Scope Out: 1:51:08 PM Scope Withdrawal Time: 0 hours 11 minutes 40 seconds  Total Procedure Duration: 0 hours 16 minutes 49 seconds  Findings:                 The perianal examination was normal.                           The digital rectal exam findings include enlarged                            prostate. Pertinent negatives include no palpable                            rectal lesions.  Two sessile polyps were found in the sigmoid colon                            and ascending colon. The polyps were diminutive in                            size. These polyps were removed with a cold biopsy                            forceps. Resection and retrieval were complete.                            Verification of patient identification for the                            specimen was done. Estimated blood loss was minimal.                           Multiple small and large-mouthed diverticula were                            found in the left colon.                           The exam was otherwise without abnormality on                            direct and retroflexion views. Complications:            No immediate complications. Estimated Blood Loss:     Estimated blood loss was minimal. Impression:               - Enlarged prostate found on digital rectal exam.                            - Two diminutive polyps in the sigmoid colon and in                            the ascending colon, removed with a cold biopsy                            forceps. Resected and retrieved.                           - Diverticulosis in the left colon.                           - The examination was otherwise normal on direct                            and retroflexion views.                           Erosive gastritis and duodenal ulcer thought to be  cause of iron deficiency anemia Recommendation:           - Patient has a contact number available for                            emergencies. The signs and symptoms of potential                            delayed complications were discussed with the                            patient. Return to normal activities tomorrow.                            Written discharge instructions were provided to the                            patient.                           - Resume previous diet.                           - Continue present medications.                           - See the other procedure note for documentation of                            additional recommendations.                           - No repeat colonoscopy due to age. Gatha Mayer, MD 08/19/2017 2:15:23 PM This report has been signed electronically.

## 2017-08-19 NOTE — Progress Notes (Signed)
Called to room to assist during endoscopic procedure.  Patient ID and intended procedure confirmed with present staff. Received instructions for my participation in the procedure from the performing physician.  

## 2017-08-19 NOTE — Op Note (Signed)
[facility] Patient Name: Jobani Sabado Procedure Date: 08/19/2017 12:43 PM MRN: [id number] Endoscopist: Gatha Mayer , MD Age: 77 Referring MD:  Date of Birth: [birth year] Gender: Male Account #: [id number] Procedure:                Upper GI endoscopy Indications:              Iron deficiency anemia Medicines:                Propofol per Anesthesia, Monitored Anesthesia Care Procedure:                Pre-Anesthesia Assessment:                           - Prior to the procedure, a History and Physical                            was performed, and patient medications and                            allergies were reviewed. The patient's tolerance of                            previous anesthesia was also reviewed. The risks                            and benefits of the procedure and the sedation                            options and risks were discussed with the patient.                            All questions were answered, and informed consent                            was obtained. Prior Anticoagulants: The patient                            last took ibuprofen 1 day prior to the procedure.                            ASA Grade Assessment: III - A patient with severe                            systemic disease. After reviewing the risks and                            benefits, the patient was deemed in satisfactory                            condition to undergo the procedure.                           After obtaining informed consent, the endoscope was  passed under direct vision. Throughout the                            procedure, the patient's blood pressure, pulse, and                            oxygen saturations were monitored continuously. The                            Endoscope was introduced through the mouth, and                            advanced to the second part of duodenum. The upper                            GI endoscopy was  accomplished without difficulty.                            The patient tolerated the procedure well. Scope In: Scope Out: Findings:                 Diffuse severe inflammation characterized by                            erosions was found in the gastric antrum. Biopsies                            were taken with a cold forceps for histology.                            Verification of patient identification for the                            specimen was done. Estimated blood loss was minimal.                           One non-bleeding cratered duodenal ulcer with                            pigmented material was found in the duodenal bulb.                           Patchy mild inflammation characterized by erosions                            and erythema was found in the duodenal bulb.                           Esophagitis with no bleeding was found at the                            gastroesophageal junction.                           The exam was  otherwise without abnormality.                           The cardia and gastric fundus were normal on                            retroflexion. Complications:            No immediate complications. Estimated Blood Loss:     Estimated blood loss was minimal. Impression:               - Chronic gastritis. Biopsied.                           - One non-bleeding duodenal ulcer with pigmented                            material.                           - Duodenitis.                           - Reflux esophagitis. One tiny area of inflammation                            at GE junction                           - The examination was otherwise normal. Recommendation:           - Patient has a contact number available for                            emergencies. The signs and symptoms of potential                            delayed complications were discussed with the                            patient. Return to normal activities tomorrow.                             Written discharge instructions were provided to the                            patient.                           - Resume previous diet.                           - Continue present medications.                           - Use Protonix (pantoprazole) 40 mg PO daily                            indefinitely.                           -  See the other procedure note for documentation of                            additional recommendations. Colonoscopy next                           - try to stop ibuprofen and switch to acetaminophen Gatha Mayer, MD 08/19/2017 2:11:20 PM This report has been signed electronically.

## 2017-08-19 NOTE — Progress Notes (Signed)
Report given to PACU, vss 

## 2017-08-19 NOTE — Patient Instructions (Addendum)
I found stomach inflammation and a duodenal ulcer probably coming from ibuprofen use. I took biopsies to see if an infection is related.  I am starting pantoprazole to treat this and will let you know biopsy results and plans.  Two tiny polyps removed from the colon. I do not anticipate that you will need a follow-up colonoscopy exam.   Stopping ibuprofen is best right now and try to use acetaminophen instead. If you are misreable without ibuprofen then can use BUT MUST STAY ON PANTOPRAZOLE   I appreciate the opportunity to care for you. Gatha Mayer, MD, FACG  YOU HAD AN ENDOSCOPIC PROCEDURE TODAY AT [facility]:   Refer to the procedure report that was given to you for any specific questions about what was found during the examination.  If the procedure report does not answer your questions, please call your gastroenterologist to clarify.  If you requested that your care partner not be given the details of your procedure findings, then the procedure report has been included in a sealed envelope for you to review at your convenience later.  YOU SHOULD EXPECT: Some feelings of bloating in the abdomen. Passage of more gas than usual.  Walking can help get rid of the air that was put into your GI tract during the procedure and reduce the bloating. If you had a lower endoscopy (such as a colonoscopy or flexible sigmoidoscopy) you may notice spotting of blood in your stool or on the toilet paper. If you underwent a bowel prep for your procedure, you may not have a normal bowel movement for a few days.  Please Note:  You might notice some irritation and congestion in your nose or some drainage.  This is from the oxygen used during your procedure.  There is no need for concern and it should clear up in a day or so.  SYMPTOMS TO REPORT IMMEDIATELY:   Following lower endoscopy (colonoscopy or flexible sigmoidoscopy):  Excessive amounts of blood in the stool  Significant  tenderness or worsening of abdominal pains  Swelling of the abdomen that is new, acute  Fever of 100F or higher   Following upper endoscopy (EGD)  Vomiting of blood or coffee ground material  New chest pain or pain under the shoulder blades  Painful or persistently difficult swallowing  New shortness of breath  Fever of 100F or higher  Black, tarry-looking stools  For urgent or emergent issues, a gastroenterologist can be reached at any hour by calling [phone].   DIET:  We do recommend a small meal at first, but then you may proceed to your regular diet.  Drink plenty of fluids but you should avoid alcoholic beverages for 24 hours.  ACTIVITY:  You should plan to take it easy for the rest of today and you should NOT DRIVE or use heavy machinery until tomorrow (because of the sedation medicines used during the test).    FOLLOW UP: Our staff will call the number listed on your records the next business day following your procedure to check on you and address any questions or concerns that you may have regarding the information given to you following your procedure. If we do not reach you, we will leave a message.  However, if you are feeling well and you are not experiencing any problems, there is no need to return our call.  We will assume that you have returned to your regular daily activities without incident.  If any biopsies were  taken you will be contacted by phone or by letter within the next 1-3 weeks.  Please call us at [phone] if you have not heard about the biopsies in 3 weeks.    SIGNATURES/CONFIDENTIALITY: You and/or your care partner have signed paperwork which will be entered into your electronic medical record.  These signatures attest to the fact that that the information above on your After Visit Summary has been reviewed and is understood.  Full responsibility of the confidentiality of this discharge information lies with you and/or your care-partner.

## 2017-08-19 NOTE — Progress Notes (Signed)
Pt states no allergies to soy or egg  1250:  Pt administered fleet enema as ordered per Dr. Carlean Purl., had yellow liquid results.

## 2017-08-20 ENCOUNTER — Telehealth: Payer: Self-pay | Admitting: *Deleted

## 2017-08-20 NOTE — Telephone Encounter (Signed)
  Follow up Call-  Call back number 08/19/2017  Post procedure Call Back phone  # [phone]  Permission to leave phone message Yes     Patient questions:  Do you have a fever, pain , or abdominal swelling? No. Pain Score  0 *  Have you tolerated food without any problems? Yes.    Have you been able to return to your normal activities? Yes.    Do you have any questions about your discharge instructions: Diet   No. Medications  No. Follow up visit  No.  Do you have questions or concerns about your Care? No.  Actions: * If pain score is 4 or above: No action needed, pain <4.

## 2017-08-24 ENCOUNTER — Encounter: Payer: Self-pay | Admitting: Internal Medicine

## 2017-08-24 DIAGNOSIS — Z86010 Personal history of colonic polyps: Secondary | ICD-10-CM

## 2017-08-24 DIAGNOSIS — Z860101 Personal history of adenomatous and serrated colon polyps: Secondary | ICD-10-CM

## 2017-08-24 DIAGNOSIS — K2960 Other gastritis without bleeding: Secondary | ICD-10-CM

## 2017-08-24 DIAGNOSIS — T39395A Adverse effect of other nonsteroidal anti-inflammatory drugs [NSAID], initial encounter: Secondary | ICD-10-CM | POA: Insufficient documentation

## 2017-08-24 HISTORY — DX: Adverse effect of other nonsteroidal anti-inflammatory drugs (NSAID), initial encounter: K29.60

## 2017-08-24 HISTORY — DX: Personal history of colonic polyps: Z86.010

## 2017-08-24 HISTORY — DX: Personal history of adenomatous and serrated colon polyps: Z86.0101

## 2017-08-24 NOTE — Progress Notes (Signed)
Gastritis no h pylori 2 adenomas No recall due to age

## 2018-04-07 ENCOUNTER — Other Ambulatory Visit: Payer: Self-pay | Admitting: Internal Medicine

## 2018-04-07 DIAGNOSIS — R0789 Other chest pain: Secondary | ICD-10-CM

## 2018-04-07 DIAGNOSIS — J690 Pneumonitis due to inhalation of food and vomit: Secondary | ICD-10-CM

## 2018-04-18 ENCOUNTER — Ambulatory Visit
Admission: RE | Admit: 2018-04-18 | Discharge: 2018-04-18 | Disposition: A | Discharge status: Home / Self Care | Payer: Medicare Other | Source: Ambulatory Visit | Attending: Internal Medicine | Admitting: Internal Medicine

## 2018-04-18 DIAGNOSIS — R0789 Other chest pain: Secondary | ICD-10-CM

## 2018-04-18 DIAGNOSIS — J690 Pneumonitis due to inhalation of food and vomit: Secondary | ICD-10-CM

## 2018-06-01 ENCOUNTER — Other Ambulatory Visit: Payer: Self-pay | Admitting: Internal Medicine

## 2018-06-01 DIAGNOSIS — N289 Disorder of kidney and ureter, unspecified: Secondary | ICD-10-CM

## 2018-06-12 ENCOUNTER — Ambulatory Visit
Admission: RE | Admit: 2018-06-12 | Discharge: 2018-06-12 | Disposition: A | Discharge status: Home / Self Care | Payer: Medicare Other | Source: Ambulatory Visit | Attending: Internal Medicine | Admitting: Internal Medicine

## 2018-06-12 DIAGNOSIS — N289 Disorder of kidney and ureter, unspecified: Secondary | ICD-10-CM

## 2018-06-12 MED ORDER — GADOBENATE DIMEGLUMINE 529 MG/ML IV SOLN
20.0000 mL | Freq: Once | INTRAVENOUS | Status: AC | PRN
  Administered 2018-06-12: 20 mL via INTRAVENOUS

## 2018-09-05 ENCOUNTER — Other Ambulatory Visit: Payer: Self-pay | Admitting: Internal Medicine

## 2018-12-02 ENCOUNTER — Other Ambulatory Visit: Payer: Self-pay | Admitting: Internal Medicine

## 2019-04-26 ENCOUNTER — Other Ambulatory Visit: Payer: Self-pay | Admitting: Otolaryngology

## 2019-05-01 SURGERY — LAPAROTOMY, EXPLORATORY
Anesthesia: General

## 2019-10-03 ENCOUNTER — Other Ambulatory Visit: Payer: Self-pay | Admitting: Physician Assistant

## 2019-10-05 ENCOUNTER — Other Ambulatory Visit: Payer: Self-pay | Admitting: Physician Assistant

## 2019-10-05 DIAGNOSIS — M25512 Pain in left shoulder: Secondary | ICD-10-CM

## 2019-11-06 ENCOUNTER — Ambulatory Visit
Admission: RE | Admit: 2019-11-06 | Discharge: 2019-11-06 | Disposition: A | Discharge status: Home / Self Care | Payer: Medicare Other | Source: Ambulatory Visit | Attending: Physician Assistant | Admitting: Physician Assistant

## 2019-11-06 ENCOUNTER — Other Ambulatory Visit: Payer: Self-pay

## 2019-11-06 DIAGNOSIS — M25512 Pain in left shoulder: Secondary | ICD-10-CM

## 2021-10-04 IMAGING — MR MR SHOULDER*L* W/O CM
4 of 5 series · 27 of 40 positions shown · non-contrast
Comparison: None.

CLINICAL DATA: Left shoulder pain and limited range of motion for 4
months. No known injury.

EXAM:
MRI OF THE LEFT SHOULDER WITHOUT CONTRAST
TECHNIQUE: Multiplanar, multisequence MR imaging of the shoulder was performed.
No intravenous contrast was administered.

[Series 4: PD fat-sat · axial · 4.0mm · 0.27mm/px · z∈[-22,+65]mm · 9 of 19 slices shown (1 of 2)]
[im 1/19]
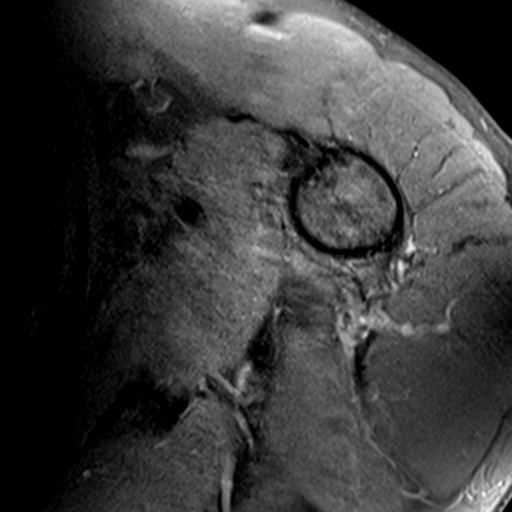
[im 3/19]
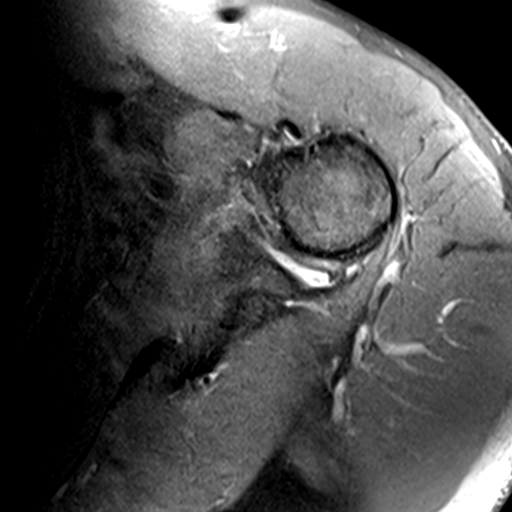
[im 5/19]
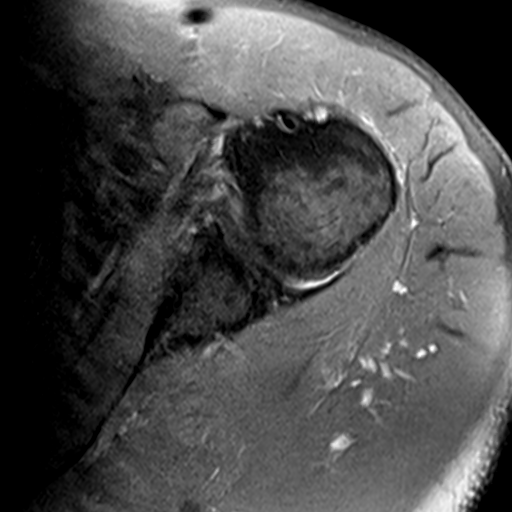
[im 7/19]
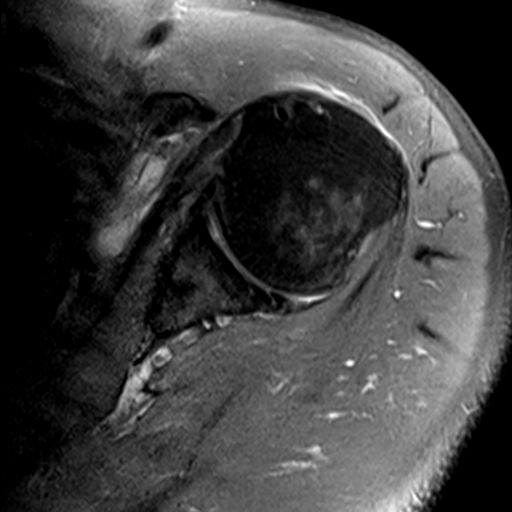
[im 10/19]
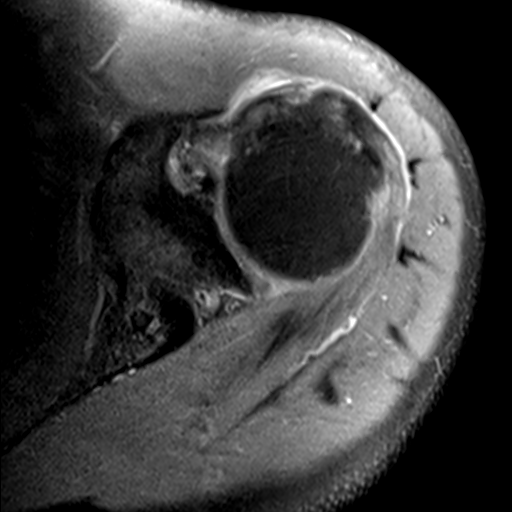
[im 12/19]
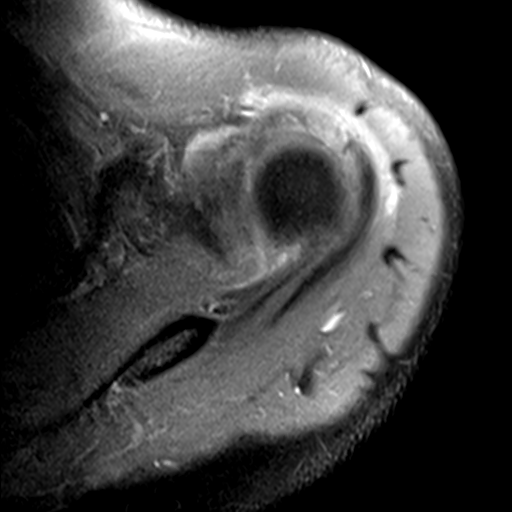
[im 14/19]
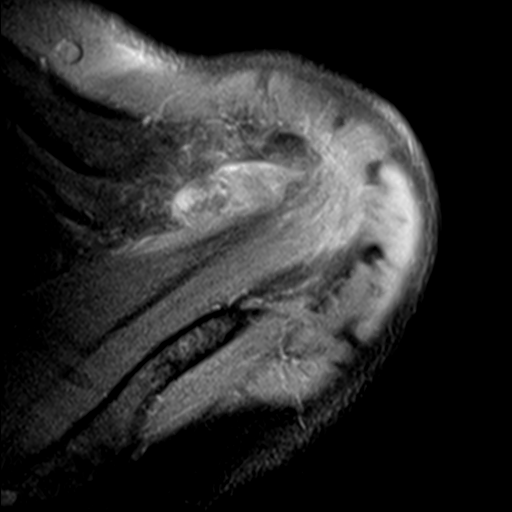
[im 16/19]
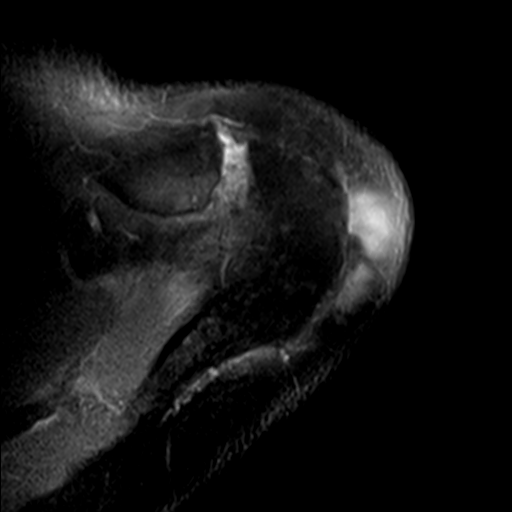
[im 19/19]
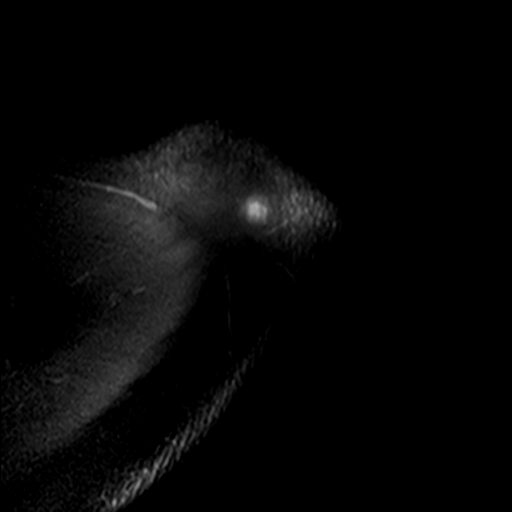

[Series 5: T2 fat-sat · oblique · 4.0mm · 0.55mm/px · 8 of 17 slices shown (1 of 2)]
[im 1/17]
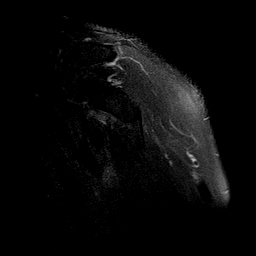
[im 3/17]
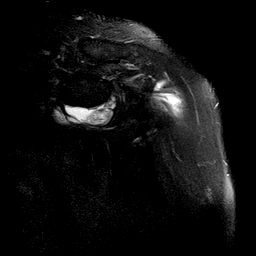
[im 5/17]
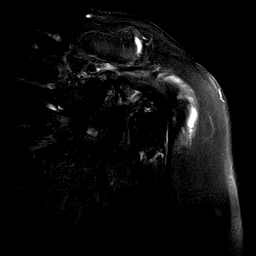
[im 7/17]
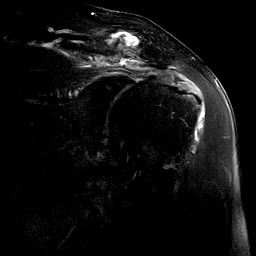
[im 10/17]
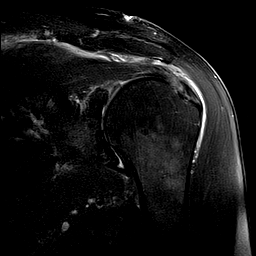
[im 12/17]
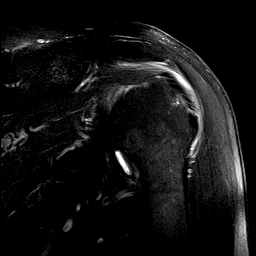
[im 14/17]
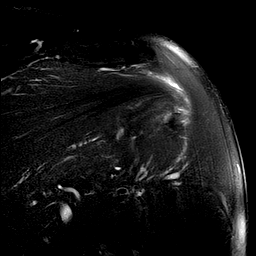
[im 17/17]
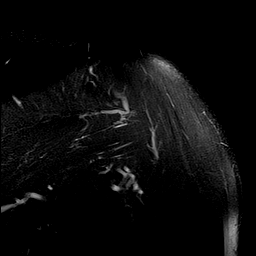

[Series 6: PD fat-sat · oblique · 4.0mm · 0.27mm/px · 7 of 17 slices shown (2 of 2)]
[im 1/17]
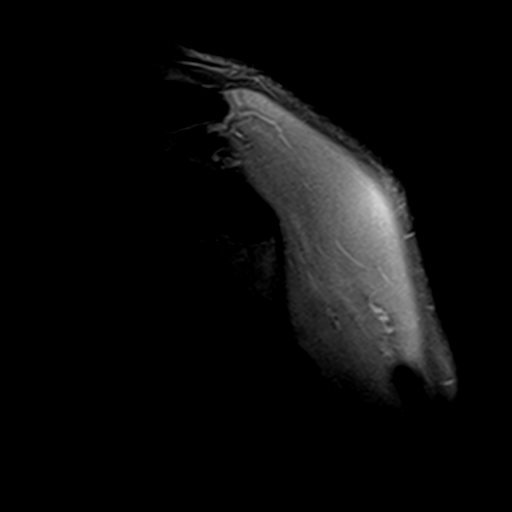
[im 3/17]
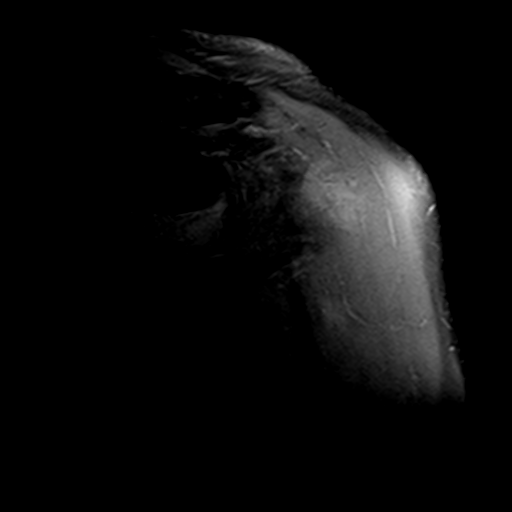
[im 6/17]
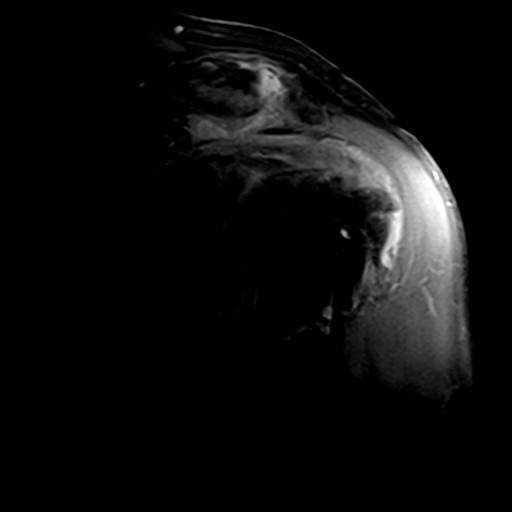
[im 9/17]
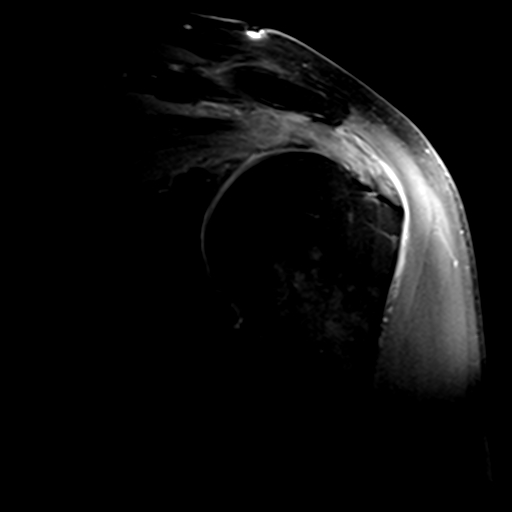
[im 11/17]
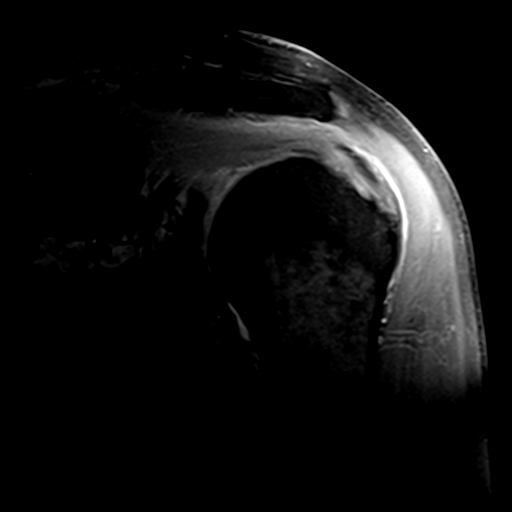
[im 14/17]
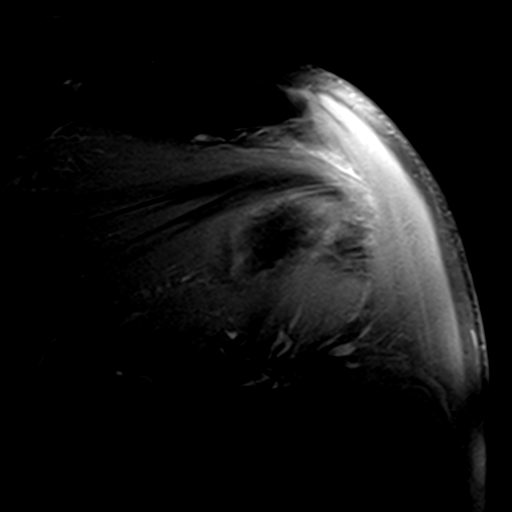
[im 17/17]
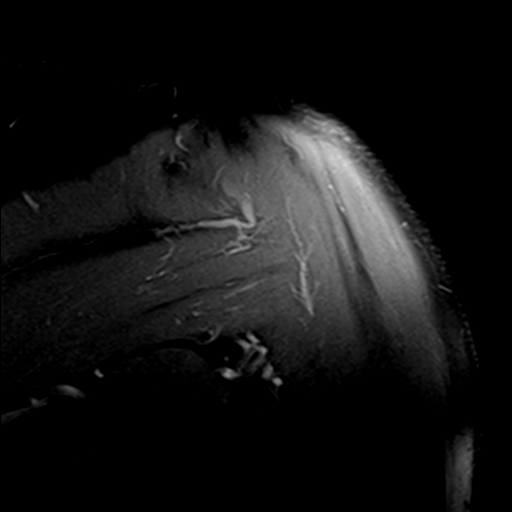

[Series 7: T2 fat-sat · oblique · 4.0mm · 0.55mm/px · 3 of 19 slices shown (2 of 2)]
[im 3/19]
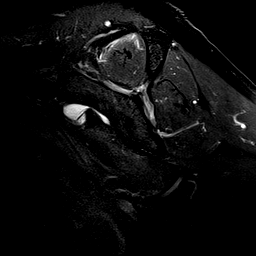
[im 11/19]
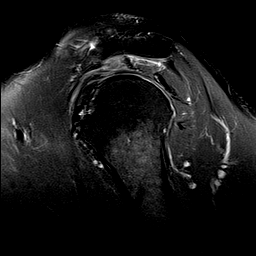
[im 16/19]
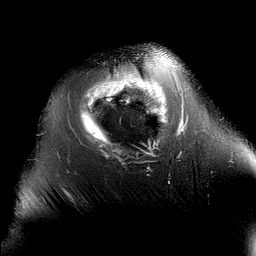

[27 of 40 positions shown; findings below may reference images not displayed]

FINDINGS: Rotator cuff: The patient has rotator cuff tendinopathy which is
most severe in the supraspinatus where the tendon is thickened with
heterogeneously increased T2 signal. Fissuring in the far lateral
tendon at the greater tuberosity but no tear or tendon retraction is
identified.

Muscles:  Normal without atrophy or focal lesion.

Biceps long head:  Intact and normal appearance.

Acromioclavicular Joint: Moderate osteoarthritis. Type 2 acromion.
Small to moderate volume of fluid is seen in the
subacromial/subdeltoid bursa.

Glenohumeral Joint: Mild osteoarthritis is present.

Labrum:  Intact.  The superior labrum is degenerated.

Bones:  No fracture, contusion or worrisome lesion.

Other: None.
IMPRESSION: Rotator cuff tendinopathy without tear is most severe in the
supraspinatus. There is fissuring in the distal supraspinatus but no
focal tear or tendon retraction is seen.

Moderate acromioclavicular osteoarthritis.

Subacromial/subdeltoid bursitis.

## 2022-05-08 ENCOUNTER — Encounter: Payer: Self-pay | Admitting: Nurse Practitioner

## 2022-06-08 ENCOUNTER — Ambulatory Visit: Payer: Medicare Other | Admitting: Nurse Practitioner

## 2024-01-12 ENCOUNTER — Other Ambulatory Visit: Payer: Self-pay | Admitting: Nurse Practitioner

## 2024-01-12 DIAGNOSIS — R609 Edema, unspecified: Secondary | ICD-10-CM

## 2024-01-13 ENCOUNTER — Ambulatory Visit
Admission: RE | Admit: 2024-01-13 | Discharge: 2024-01-13 | Disposition: A | Discharge status: Home / Self Care | Payer: Self-pay | Source: Ambulatory Visit | Attending: Nurse Practitioner | Admitting: Nurse Practitioner

## 2024-01-13 DIAGNOSIS — R609 Edema, unspecified: Secondary | ICD-10-CM

## 2024-01-24 ENCOUNTER — Encounter: Payer: Self-pay | Admitting: Neurology

## 2024-01-24 ENCOUNTER — Ambulatory Visit: Admitting: Neurology

## 2024-01-24 VITALS — BP 108/60 | HR 60 | Ht 71.0 in | Wt 261.2 lb

## 2024-01-24 DIAGNOSIS — R609 Edema, unspecified: Secondary | ICD-10-CM | POA: Insufficient documentation

## 2024-01-24 DIAGNOSIS — M150 Primary generalized (osteo)arthritis: Secondary | ICD-10-CM | POA: Diagnosis not present

## 2024-01-24 DIAGNOSIS — L97929 Non-pressure chronic ulcer of unspecified part of left lower leg with unspecified severity: Secondary | ICD-10-CM | POA: Insufficient documentation

## 2024-01-24 DIAGNOSIS — L97919 Non-pressure chronic ulcer of unspecified part of right lower leg with unspecified severity: Secondary | ICD-10-CM

## 2024-01-24 NOTE — Progress Notes (Addendum)
 [facility]    Provider:  Neomia Banner, MD  Primary Care Physician:  Carolyn Cisco, NP [address] [place] [address]     Referring Provider: Carolyn Cisco, Np [address] [place],  [place] [address]          Chief Complaint according to patient   Patient presents with:     New Patient (Initial Visit)  [facility]    Pt saw MD in 2018 for RLS. PCP referred him for new issues of dropping items without realizing it and is not sure if medication related or an underlying issue. Pt on med (gabapentin) for RLS but states is costly and is concerned about being able to continue due cost. Pt not currently taking ropinirole.            HISTORY OF PRESENT ILLNESS:  Ruben Holmes is a 83 y.o. male patient who is seen upon new Primary Care  provider NP Bevin Bucks, for a referral on 01/24/2024  for a RLS  re- evaluation (?).    He is most concerned about dropping objects with the right hand or left- can be paper, coffee or similar - and ha has had carpal tunnel repaired right without complication but has such severe arthritis that this alone may explain the dropping.  He has edema in both legs,  PVD dx, and weeping wounds in the left over right leg.   US   of the arteries showed no blockages, but impaired veinous return , no Hx of DVT.  His BP is very well controlled, now.  He reports orthostatic dizziness in the mornings.   This edema began 6-8 months ago and has worsened.  This is not Neuropathy or RLS - this is edema with vascular disease.   Started on Magnesium by NP Bevin Bucks.  Neurontin  300 MG  was started while still living in [facility] and is continued, 300 MG.  Currently not on Iron.  Not on sinement, not on ropinorol , can't afford the XR form.    Chief concern according to patient :   see above      Review of Systems: Out of a complete 14 system review, the patient complains of only the following symptoms, and all other reviewed  systems are negative.:  = Insomnia, RLS, Nocturia OEDEMA _ on Lasix.    How likely are you to doze in the following situations: 0 = not likely, 1 = slight chance, 2 = moderate chance, 3 = high chance   Sitting and Reading? Watching Television? Sitting inactive in a public place (theater or meeting)? As a passenger in a car for an hour without a break? Lying down in the afternoon when circumstances permit? Sitting and talking to someone? Sitting quietly after lunch without alcohol? In a car, while stopped for a few minutes in traffic?   Total = 5/ 24 points   FSS endorsed at X/ 63 points.   Social History   Socioeconomic History   Marital status: Widowed    Spouse name: Not on file   Number of children: 2   Years of education: Not on file   Highest education level: Not on file  Occupational History   Occupation: retired  Tobacco Use   Smoking status: Former    Current packs/day: 0.00    Average packs/day: 1.5 packs/day for 40.0 years (60.0 ttl pk-yrs)    Types: Cigarettes    Start date:  1956    Quit date: 2    Years since quitting: 29.4   Smokeless tobacco: Never  Vaping Use   Vaping status: Never Used  Substance and Sexual Activity   Alcohol use: No   Drug use: No   Sexual activity: Not on file  Other Topics Concern   Not on file  Social History Narrative   The patient is widowed.  Months.  He relocated from the [place] area within the past couple of years, as he has a sister that lives in [place].  He was a IT trainer, retired.   Also a Sales executive   Former smoker   No alcohol or drug use   07/12/2017   Social Drivers of Health   Financial Resource Strain: Low Risk  (08/24/2023)   Received from [facility]   Overall Financial Resource Strain (CARDIA)    Difficulty of Paying Living Expenses: Not hard at all  Food Insecurity: No Food Insecurity (08/24/2023)   Received from [facility]   Hunger Vital Sign    Within the past 12 months, you worried  that your food would run out before you got the money to buy more.: Never true    Within the past 12 months, the food you bought just didn't last and you didn't have money to get more.: Never true  Transportation Needs: No Transportation Needs (08/24/2023)   Received from [facility] - Transportation    Lack of Transportation (Medical): No    Lack of Transportation (Non-Medical): No  Physical Activity: Sufficiently Active (04/19/2023)   Received from [facility]   Exercise Vital Sign    On average, how many days per week do you engage in moderate to strenuous exercise (like a brisk walk)?: 3 days    On average, how many minutes do you engage in exercise at this level?: 60 min  Stress: No Stress Concern Present (04/19/2023)   Received from [facility] - Occupational Stress Questionnaire    Feeling of Stress : Only a little  Social Connections: Socially Integrated (04/19/2023)   Received from [facility]   Social Network    How would you rate your social network (family, work, friends)?: Good participation with social networks    Family History  Problem Relation Age of Onset   Arthritis Mother    Ulcers Mother    Breast cancer Mother    Liver cancer Mother    Diabetes Father    Arthritis Father    Heart attack Father    Hypertension Father    Irritable bowel syndrome Sister    Lung cancer Brother        mets to brain   Cancer Brother    Psoriasis Brother     Past Medical History:  Diagnosis Date   Arthritis    neck, hands   BPH associated with nocturia    Carpal tunnel syndrome on right    Chronic low back pain    Complication of anesthesia    GERD (gastroesophageal reflux disease)    Gout    Hx of adenomatous colonic polyps 08/24/2017   2 diminutive No recall - age   Hypertension    Insomnia    Microcytic anemia    NSAID induced gastritis 08/24/2017   Osteoarthritis of both thumbs    MCP joints   Plantar  fasciitis, left 06/2016   Pneumonia    PONV (postoperative nausea and vomiting)  Restless leg syndrome     Past Surgical History:  Procedure Laterality Date   bilateral blepharoplasty surgery Bilateral 2016   CARPAL TUNNEL RELEASE Right 07/30/2016   Procedure: RIGHT CARPAL TUNNEL RELEASE;  Surgeon: Lyanne Sample, MD;  Location: [facility];  Service: Orthopedics;  Laterality: Right;   CATARACT EXTRACTION Bilateral    cataract with laser surgery in both eyes   colonscopy     one polyp   laser treatment for BPH  2006   LUMBAR SPINE SURGERY  1973   MASS EXCISION     back   NASAL SEPTUM SURGERY  2019   TOTAL KNEE ARTHROPLASTY Bilateral    bTKR, 2008 left and 2009 right   ULNAR NERVE TRANSPOSITION Right 07/30/2016   Procedure: RIGHT ULNAR NERVE DECOMPRESSION/TRANSPOSITION;  Surgeon: Lyanne Sample, MD;  Location: [facility];  Service: Orthopedics;  Laterality: Right;     Current Outpatient Medications on File Prior to Visit  Medication Sig Dispense Refill   carbidopa-levodopa (SINEMET IR) 10-100 MG tablet Take 1 tablet by mouth 2 (two) times daily. (Patient taking differently: Take 1 tablet by mouth daily.)     magnesium oxide (MAG-OX) 400 (241.3 Mg) MG tablet Take 400 mg by mouth daily.     Cholecalciferol (VITAMIN D3) 2000 units TABS Take 1 tablet by mouth daily. (Patient not taking: Reported on 01/24/2024)     Misc Natural Products (OSTEO BI-FLEX ADV JOINT SHIELD PO) Take 1 tablet by mouth 2 (two) times daily.  (Patient not taking: Reported on 01/24/2024)     Nutritional Supplements (GRAPESEED EXTRACT) 500-50 MG CAPS Take by mouth.     pantoprazole  (PROTONIX ) 40 MG tablet TAKE 1 TABLET BY MOUTH DAILY BEFORE BREAKFAST 90 tablet 0   Polysaccharide Iron Complex (FERREX 150 PO) Take 1 tablet by mouth daily.     Potassium 99 MG TABS Take 1 tablet by mouth daily.     rOPINIRole (REQUIP) 1 MG tablet Take 5 mg by mouth 3 (three) times daily.      tamsulosin (FLOMAX)  0.4 MG CAPS capsule Take 0.4 mg by mouth.     No current facility-administered medications on file prior to visit.    Allergies  Allergen Reactions   Ace Inhibitors Swelling    Throat swelling   Angiotensin Receptor Blockers Rash    itching     DIAGNOSTIC DATA (LABS, IMAGING, TESTING) - I reviewed patient records, labs, notes, testing and imaging myself where available.  Lab Results  Component Value Date   WBC 9.2 07/07/2016   HGB 16.0 07/30/2016   HCT 47.0 07/30/2016   MCV 79.8 07/07/2016   PLT 264 07/07/2016      Component Value Date/Time   NA 138 07/30/2016 1031   K 3.9 07/30/2016 1031   CL 103 07/30/2016 1031   GLUCOSE 94 07/30/2016 1031   BUN 20 07/30/2016 1031   CREATININE 0.80 07/30/2016 1031   No results found for: CHOL, HDL, LDLCALC, LDLDIRECT, TRIG, CHOLHDL No results found for: XBJY7W No results found for: VITAMINB12 No results found for: TSH  PHYSICAL EXAM:  Today's Vitals   01/24/24 1406  BP: 108/60  Pulse: 60  Weight: 261 lb 3.2 oz (118.5 kg)  Height: 5' 11 (1.803 m)   Body mass index is 36.43 kg/m.   Wt Readings from Last 3 Encounters:  01/24/24 261 lb 3.2 oz (118.5 kg)  08/19/17 225 lb (102.1 kg)  07/12/17 225 lb (102.1 kg)     Ht Readings from  Last 3 Encounters:  01/24/24 5' 11 (1.803 m)  08/19/17 5' 10 (1.778 m)  07/12/17 5' 10 (1.778 m)      General: The patient is awake, alert and appears not in acute distress. The patient is pleasanr.  Head: Normocephalic, atraumatic.  Neck is supple. Mallampati 2,  neck circumference:18. 5 inches . Nasal airflow not fully patent.  Retrognathia is not  seen.  Dental status:  Cardiovascular:  Regular rate and cardiac rhythm by pulse,  without distended neck veins. Respiratory:  SOB  Skin: severe bilateral edema, starting 6-8 months ago.  Trunk: The patient's posture is relaxed.  Abdominal girth.   NEUROLOGIC EXAM: The patient is awake and alert, oriented to place and  time.   Memory subjective described as intact.  Attention span & concentration ability appears normal.  Speech is fluent,  without  dysarthria, dysphonia or aphasia.  Mood and affect are appropriate.   Cranial nerves: no loss of smell or taste reported  Pupils are equal and briskly reactive to light. Funduscopic exam deferred, not well shaven.  .  Extraocular movements in vertical and horizontal planes were intact and without nystagmus. No Diplopia. Visual fields by finger perimetry are intact. Hearing was impaired mildly to soft voice and finger rubbing.   Facial sensation intact to fine touch.  Facial motor strength is symmetric and tongue and uvula move midline.  Neck ROM : rotation, tilt and flexion extension were normal for age and shoulder droopy on the right, sports injury related,  biceps on the left with cogwheeling.    Motor exam:  Symmetric bulk, tone and ROM. Biceps  torn on the  right-   abnormal tone with cog =wheeling,  biceps  on the left- but  very low  right hand grip strength .   Sensory:  Fine touch, pinprick and vibration were all affected in the edematous legs. Proprioception tested in the upper extremities was normal.   Coordination: Rapid alternating movements in the fingers/hands were of normal speed.  The Finger-to-nose maneuver was  with   bilateral evidence of ataxia ,  tremor.    ASSESSMENT AND PLAN:  83 y.o. 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 No DVT seen in  doppler from 01-15-2024.   His RLS symptoms were improved under 24 h release medications and he can't afford these now.  He feels the symptoms are present all day- that's unusual.  He then clarified he is still on 2.25 Er  and combined this with carbidopa levo dopa and this combination works for him-  through the night.  There is no change needed- he has to  keep taking it.   2)  Dr Marston Skiff  had dx Venous insufficiency and lymphedema.   He has numbness in foot and toes  since knee surgeries.  Lasix causes Nocturia, affect sleep.   3)  his hand is weaker due to arthritis and a previously dx carpal tunnel syndrome - its his right and dominant hand 's his weakest.  Please continue on 2.25 Er ropinorole and on C dopa- L dopa as currently prescribed, It works !  Careful with gabapentin due to edema potential.  He is SOB !   He may benefit from OT.   We can't do NCV and EMG on these legs.  Plan :  try to get the edema reduced and return to  compression stockings, may need made to measure with Zippers.   I feel the patient needs to see cardiology , (SOB )not sleep neurology.   After spending a total time of  40  minutes face to face and additional time for physical and neurologic examination, review of laboratory studies,  personal review of imaging studies, reports and results of other testing and review of referral information / records as far as provided in visit,   Electronically signed by: Neomia Banner, MD 01/24/2024 2:12 PM  [facility] Board certified by The [employer] of Sleep Medicine and Diplomate of the [employer] of Sleep Medicine. Board certified through the ABPN, Fellow of the [employer] of Neurology.

## 2024-02-15 ENCOUNTER — Other Ambulatory Visit: Payer: Self-pay | Admitting: Nurse Practitioner

## 2024-02-15 ENCOUNTER — Encounter: Payer: Self-pay | Admitting: Family Medicine

## 2024-02-15 ENCOUNTER — Encounter: Payer: Self-pay | Admitting: Nurse Practitioner

## 2024-02-15 DIAGNOSIS — I119 Hypertensive heart disease without heart failure: Secondary | ICD-10-CM

## 2024-02-16 ENCOUNTER — Ambulatory Visit
Admission: RE | Admit: 2024-02-16 | Discharge: 2024-02-16 | Disposition: A | Discharge status: Home / Self Care | Source: Ambulatory Visit | Attending: Nurse Practitioner | Admitting: Nurse Practitioner

## 2024-02-16 DIAGNOSIS — I119 Hypertensive heart disease without heart failure: Secondary | ICD-10-CM

## 2024-03-15 ENCOUNTER — Ambulatory Visit (HOSPITAL_BASED_OUTPATIENT_CLINIC_OR_DEPARTMENT_OTHER): Admitting: General Surgery

## 2024-04-04 ENCOUNTER — Ambulatory Visit: Admitting: Neurology

## 2024-04-24 ENCOUNTER — Encounter (HOSPITAL_BASED_OUTPATIENT_CLINIC_OR_DEPARTMENT_OTHER): Attending: General Surgery | Admitting: Internal Medicine

## 2024-04-24 DIAGNOSIS — I87302 Chronic venous hypertension (idiopathic) without complications of left lower extremity: Secondary | ICD-10-CM | POA: Diagnosis not present

## 2024-04-24 DIAGNOSIS — I87301 Chronic venous hypertension (idiopathic) without complications of right lower extremity: Secondary | ICD-10-CM

## 2024-04-24 DIAGNOSIS — I87303 Chronic venous hypertension (idiopathic) without complications of bilateral lower extremity: Secondary | ICD-10-CM | POA: Diagnosis present

## 2024-04-24 DIAGNOSIS — I890 Lymphedema, not elsewhere classified: Secondary | ICD-10-CM | POA: Diagnosis not present

## 2024-07-31 ENCOUNTER — Ambulatory Visit
Admission: RE | Admit: 2024-07-31 | Discharge: 2024-07-31 | Disposition: A | Source: Ambulatory Visit | Attending: Nurse Practitioner

## 2024-07-31 ENCOUNTER — Other Ambulatory Visit: Payer: Self-pay | Admitting: Nurse Practitioner

## 2024-07-31 DIAGNOSIS — M25561 Pain in right knee: Secondary | ICD-10-CM
